# Patient Record
Sex: Female | Born: 1965 | Race: White | Hispanic: No | Smoking: Never smoker
Health system: Southern US, Community
[De-identification: ages and names within clinical notes are randomized; demographics above are authoritative.]

---

## 2001-06-11 ENCOUNTER — Other Ambulatory Visit: Admission: RE | Admit: 2001-06-11 | Discharge: 2001-06-11 | Payer: Self-pay | Admitting: Obstetrics and Gynecology

## 2006-03-04 ENCOUNTER — Ambulatory Visit (HOSPITAL_COMMUNITY): Admission: RE | Admit: 2006-03-04 | Discharge: 2006-03-04 | Payer: Self-pay | Admitting: Obstetrics and Gynecology

## 2006-12-29 IMAGING — US US BREAST*R*
1 series · 13 of 17 positions shown · non-contrast
Comparison: none

BILATERAL DIAGNOSTIC MAMMOGRAM

RIGHT BREAST ULTRASOUND
BILATERAL  DIAGNOSTIC MAMMOGRAM AND RIGHT BREAST ULTRASOUND:
CLINICAL DATA: 40-year-old with palpable abnormality right breast.

[Series 1: unknown · 0.06mm/px · 13 of 17 slices shown]
[im 1/17]
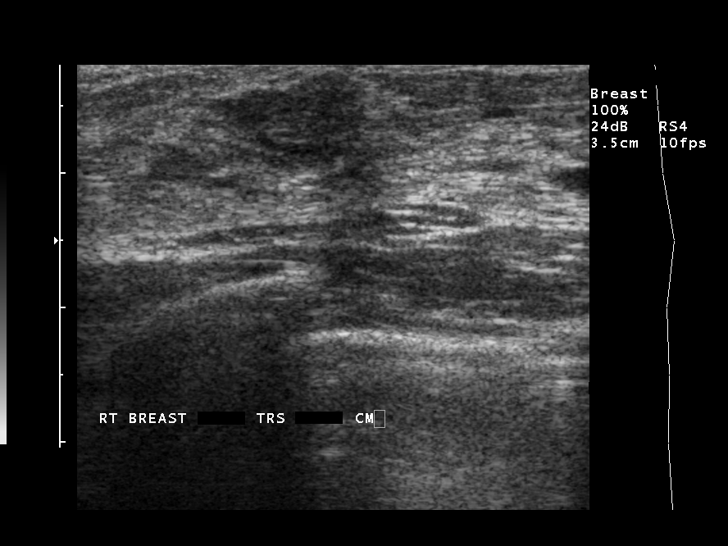
[im 2/17]
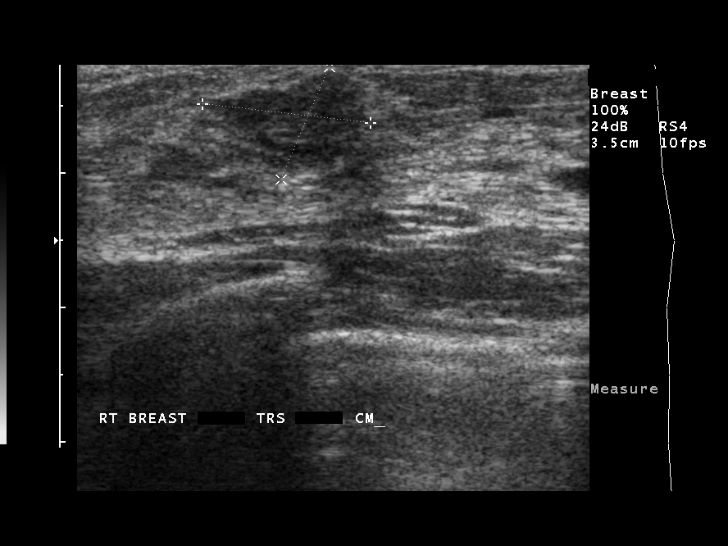
[im 4/17]
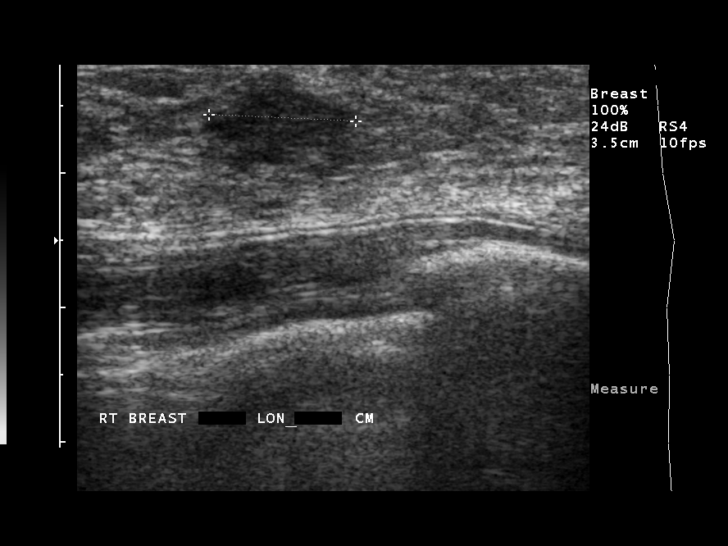
[im 5/17]
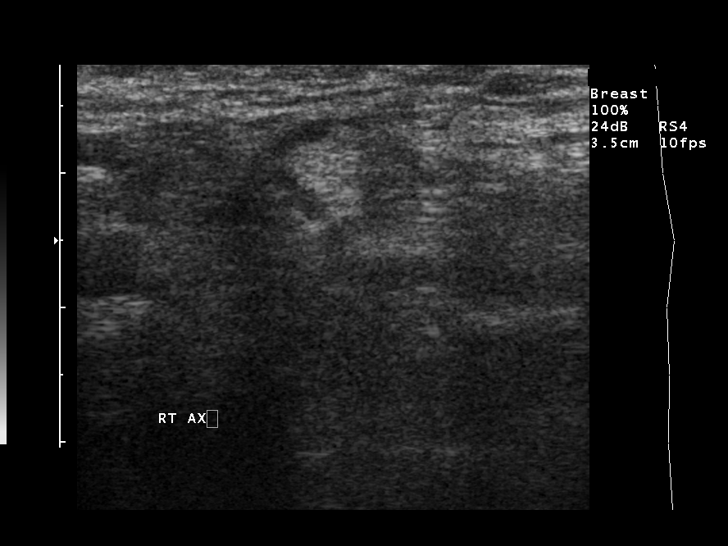
[im 6/17]
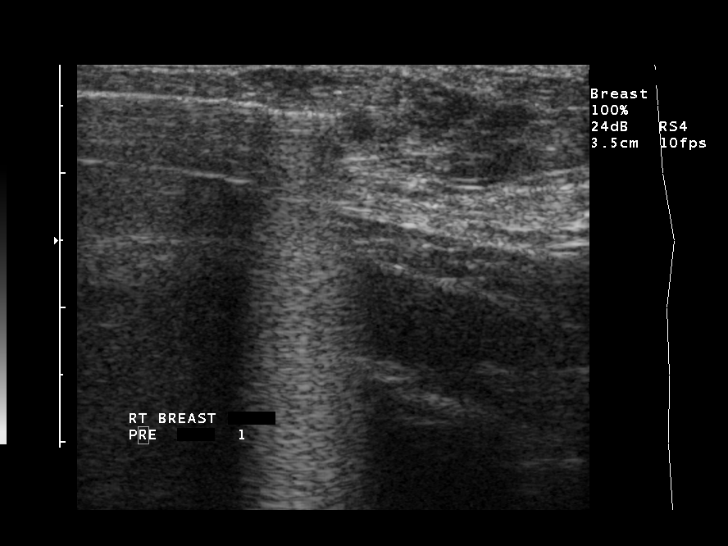
[im 8/17]
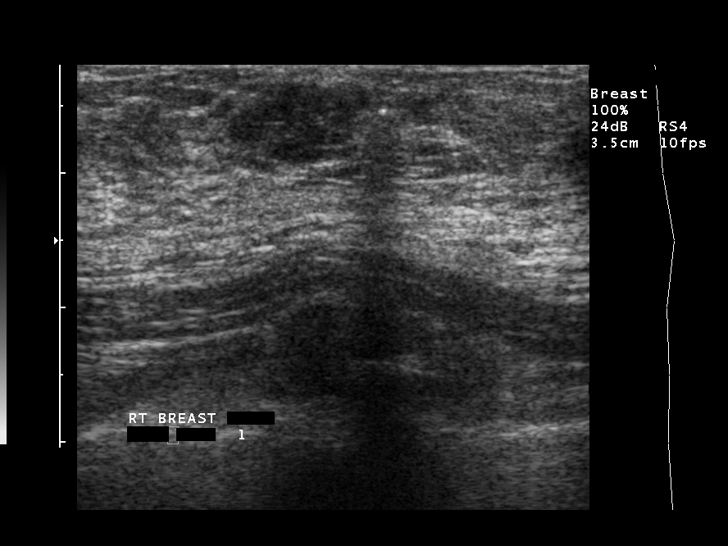
[im 9/17]
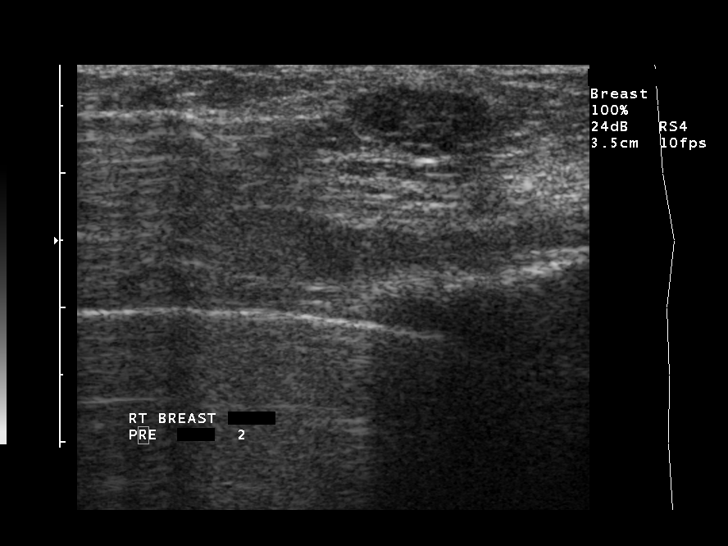
[im 10/17]
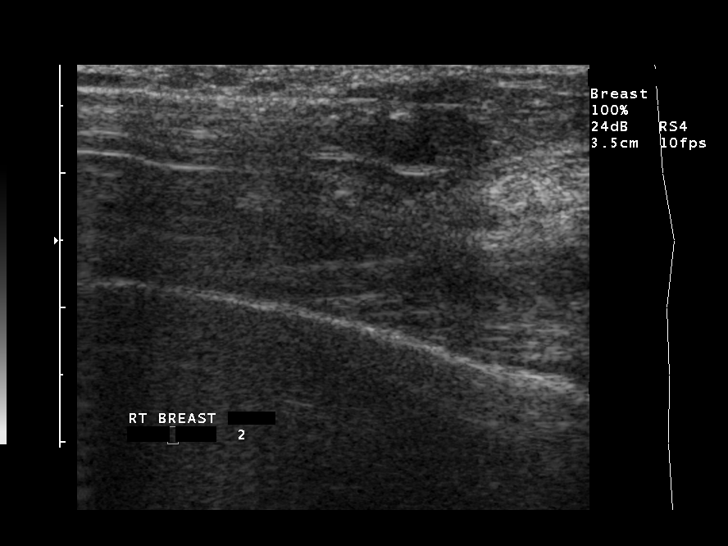
[im 12/17]
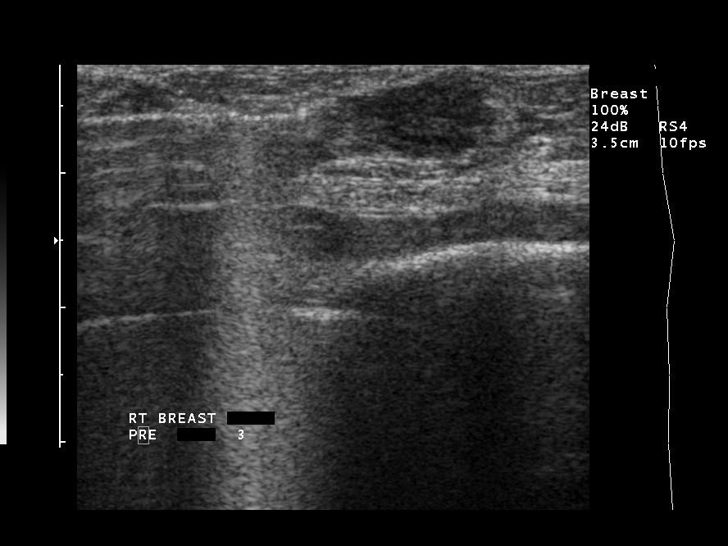
[im 13/17]
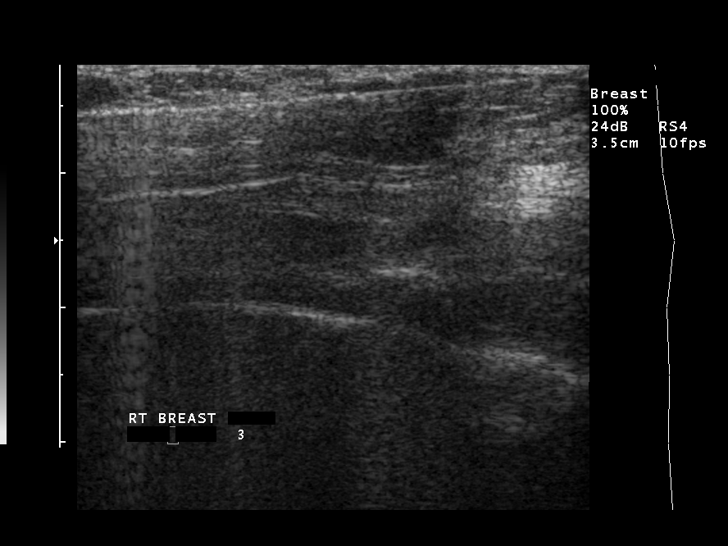
[im 14/17]
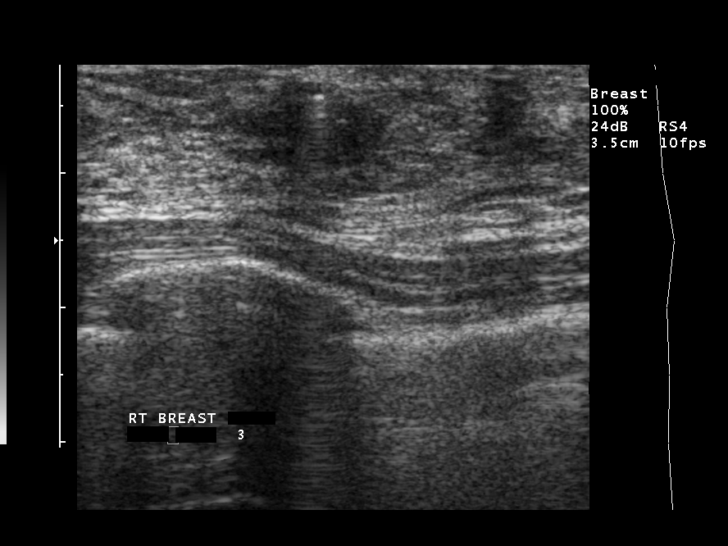
[im 16/17]
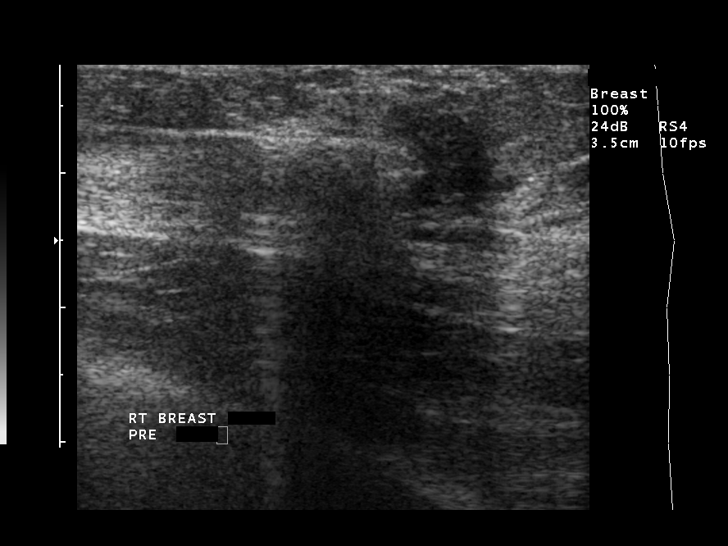
[im 17/17]
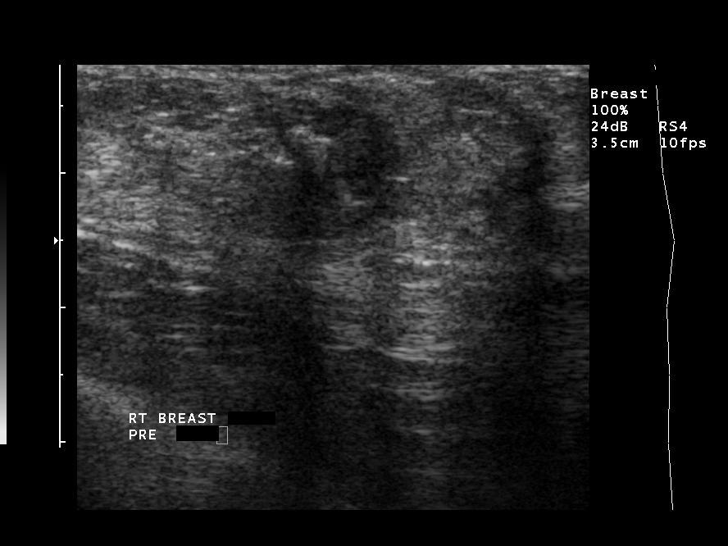

[13 of 17 positions shown; findings below may reference images not displayed]

Comparison with prior study 11-11-96.  Breast parenchyma is dense fibroglandular.  There is 
asymmetric density in the upper outer quadrant of the right breast corresponding to the area of the
patient's concern.  No associated distortion or calcifications identified.  Images of the left 
breast are unremarkable.

On physical exam, there is a soft palpable nodule in the 10 o'clock position of the right breast 5 
cm from the nipple. Ultrasound in this region shows extremely dense fibroglandular parenchyma.  
There is a small hypoechoic ill-defined nodule corresponding to the palpable abnormality.  This 
nodule is 13 x 9 x 11 mm.  Evaluation of the right axilla shows normal-appearing lymph nodes.
IMPRESSION: Indeterminate palpable mass in the right breast 10 o'clock position.  Biopsy is suggested.  I 
discussed the findings with the patient.  Biopsy is performed on the same day and dictated 
separately.  The findings were discussed with Dr. Glassl.

ASSESSMENT: Suspicious - BI-RADS 4

Needle biopsy of the right breast.
,

## 2019-09-20 ENCOUNTER — Other Ambulatory Visit: Payer: Self-pay

## 2019-09-20 ENCOUNTER — Ambulatory Visit: Payer: 59 | Attending: Internal Medicine

## 2019-09-20 DIAGNOSIS — U071 COVID-19: Secondary | ICD-10-CM | POA: Insufficient documentation

## 2019-09-20 DIAGNOSIS — Z20822 Contact with and (suspected) exposure to covid-19: Secondary | ICD-10-CM

## 2019-09-22 ENCOUNTER — Telehealth: Payer: Self-pay

## 2019-09-22 ENCOUNTER — Telehealth: Payer: Self-pay | Admitting: *Deleted

## 2019-09-22 LAB — NOVEL CORONAVIRUS, NAA: SARS-CoV-2, NAA: DETECTED — AB

## 2019-09-22 NOTE — Telephone Encounter (Signed)
Pt. Checking on COVID 19 results, not available yet. °

## 2019-09-22 NOTE — Telephone Encounter (Signed)
Pt notified that her covid 19 results have not resulted. She voiced understanding.

## 2020-09-27 ENCOUNTER — Encounter (INDEPENDENT_AMBULATORY_CARE_PROVIDER_SITE_OTHER): Payer: Self-pay | Admitting: *Deleted

## 2020-10-11 ENCOUNTER — Other Ambulatory Visit (INDEPENDENT_AMBULATORY_CARE_PROVIDER_SITE_OTHER): Payer: Self-pay

## 2020-10-11 ENCOUNTER — Telehealth (INDEPENDENT_AMBULATORY_CARE_PROVIDER_SITE_OTHER): Payer: Self-pay

## 2020-10-11 ENCOUNTER — Encounter (INDEPENDENT_AMBULATORY_CARE_PROVIDER_SITE_OTHER): Payer: Self-pay | Admitting: Internal Medicine

## 2020-10-11 ENCOUNTER — Encounter (INDEPENDENT_AMBULATORY_CARE_PROVIDER_SITE_OTHER): Payer: Self-pay

## 2020-10-11 ENCOUNTER — Ambulatory Visit (INDEPENDENT_AMBULATORY_CARE_PROVIDER_SITE_OTHER): Payer: 59 | Admitting: Internal Medicine

## 2020-10-11 ENCOUNTER — Other Ambulatory Visit: Payer: Self-pay

## 2020-10-11 DIAGNOSIS — K5900 Constipation, unspecified: Secondary | ICD-10-CM | POA: Insufficient documentation

## 2020-10-11 DIAGNOSIS — K921 Melena: Secondary | ICD-10-CM

## 2020-10-11 DIAGNOSIS — Z1211 Encounter for screening for malignant neoplasm of colon: Secondary | ICD-10-CM

## 2020-10-11 DIAGNOSIS — R109 Unspecified abdominal pain: Secondary | ICD-10-CM | POA: Diagnosis not present

## 2020-10-11 LAB — CBC: MPV: 11.6 fL (ref 7.5–12.5)

## 2020-10-11 MED ORDER — METAMUCIL SMOOTH TEXTURE 58.6 % PO POWD: 1.0000 | Freq: Every day | ORAL | Status: AC

## 2020-10-11 MED ORDER — PEG 3350-KCL-NA BICARB-NACL 420 G PO SOLR: 4000.0000 mL | ORAL | 0 refills | Status: DC

## 2020-10-11 NOTE — Telephone Encounter (Signed)
LeighAnn Beacher Every, CMA  

## 2020-10-11 NOTE — Progress Notes (Signed)
Presenting complaint;  Abdominal pain and melena.  History of present illness.  Patient is 55 year old Caucasian female who who is being evaluated at her request by Dr. Jomarie Longs by whom patient was seen at [facility] urgent care on 09/26/2018 for abdominal pain and melena.  She was begun on omeprazole and referred to our office.  Her hemoglobin was 14.1 g.  Patient states her present illness started in November 2021 when she developed nausea vomiting diarrhea and severe abdominal cramping.  Her symptoms resolved after 24 hours and she felt she either had viral illness or food poisoning.  However symptoms recurred on New Year's eve when she had mid abdominal cramping which lasted for at least 30 minutes.  She did not have other symptoms such as nausea vomiting fever or chills.  Following this episode she felt very weak and did not have a bowel movement for 1 week.  She says she is prone to constipation and her average frequency is 2 bowel movements per week.  She also has experienced abdominal pain as well as back pain and she has been using heating pad at night.  She started to take Metamucil and she finally had bowel movement on day 8.   She has been having black stools for at least 2 weeks.  She has saved pictures on her smart phone and shared these with me.  She has had multiple black stools most of which have been formed.  There was no fresh blood.  She says her appetite is fair.  She denies heartburn or dysphagia.  She has lost 8 pounds in last year. She does not take OTC NSAIDs.  She has never been diagnosed with peptic ulcer disease.  She has not been screened for colorectal carcinoma.  Current Medications: Outpatient Encounter Medications as of 10/11/2020  Medication Sig  . acetaminophen (TYLENOL) 325 MG tablet Take 650 mg by mouth every 6 (six) hours as needed.  Marland Kitchen omeprazole (PRILOSEC) 40 MG capsule Take 40 mg by mouth daily before breakfast.   No facility-administered encounter medications on  file as of 10/11/2020.   Past medical history  She had C-section in October 1984. She had another surgery 2 months later for removal of foreign body/sponge.  She had small bowel perforation repaired at that time.  Allergies  No Known Allergies   Family history  Father died at age 71 of cancer type unknown in 27. Mother died in 7 at age 68.  She had severe hypoproteinemia.  Patient does not remember as to the etiology. She has 2 sisters.  One sister has stomach issues and he wonders if she has been diagnosed with celiac disease.  Social history  She is divorced.  She has daughter age 38 in good health. 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 She does not smoke cigarettes or drink alcohol. She is very active but does not do any scheduled exercise.  Physical examination Blood pressure (!) 145/81, pulse 68, temperature 99.2 F (37.3 C), temperature source Oral, height 5' 6.5" (1.689 m), weight 138 lb 1.6 oz (62.6 kg). Patient is alert and in no acute distress. She is wearing a mask. Conjunctiva is pink. Sclera is nonicteric Oropharyngeal mucosa is normal. No neck masses or thyromegaly noted. Cardiac exam with regular rhythm normal S1 and S2. No murmur or gallop noted. Lungs are clear to auscultation. Abdomen abdomen is symmetrical.  Bowel sounds are normal.  On  palpation is soft.  She has mild tenderness in hypogastric region.  No organomegaly or masses. No LE edema or clubbing noted.  Labs/studies Results:  Hemoglobin 14.1 g on 09/26/2020.   Assessment:  #1.  Melena.  She does not have history of peptic ulcer disease and she does not take OTC NSAIDs.  It remains to be seen if she has peptic ulcer disease or right colonic lesion to explain her melena and other symptoms.  #2.  Abdominal cramping.  She has had 2 episodes in the last 2 months.  She has remote history of bowel  perforation resulting from foreign bodies sponge left in her abdomen.  Perforation was repaired as soon as identified in December 1984.  Doubt that she has developed small bowel stricture after so many years.  #3.  Constipation.  She appears to have chronic constipation which got worse during her acute illness.  Recommendations  Continue omeprazole at a dose of 40 mg by mouth 30 minutes before breakfast daily. Continue Metamucil 1 packet or 4 g by mouth daily at bedtime. Patient advised not to use OTC stimulant laxatives. Patient will go to the lab for CBC, TSH and comprehensive chemistry panel. Diagnostic esophagogastroduodenoscopy and colonoscopy to be scheduled in near future. Further recommendations to follow.

## 2020-10-12 ENCOUNTER — Other Ambulatory Visit (INDEPENDENT_AMBULATORY_CARE_PROVIDER_SITE_OTHER): Payer: Self-pay

## 2020-10-12 LAB — CBC
HCT: 44.7 % (ref 35.0–45.0)
Hemoglobin: 15.3 g/dL (ref 11.7–15.5)
MCH: 29.9 pg (ref 27.0–33.0)
MCHC: 34.2 g/dL (ref 32.0–36.0)
MCV: 87.3 fL (ref 80.0–100.0)
Platelets: 282 10*3/uL (ref 140–400)
RBC: 5.12 10*6/uL — ABNORMAL HIGH (ref 3.80–5.10)
RDW: 11.4 % (ref 11.0–15.0)
WBC: 6.9 10*3/uL (ref 3.8–10.8)

## 2020-10-12 LAB — COMPREHENSIVE METABOLIC PANEL
AG Ratio: 1.7 (calc) (ref 1.0–2.5)
ALT: 9 U/L (ref 6–29)
AST: 14 U/L (ref 10–35)
Albumin: 4.3 g/dL (ref 3.6–5.1)
Alkaline phosphatase (APISO): 52 U/L (ref 37–153)
BUN: 12 mg/dL (ref 7–25)
CO2: 30 mmol/L (ref 20–32)
Calcium: 10.3 mg/dL (ref 8.6–10.4)
Chloride: 104 mmol/L (ref 98–110)
Creat: 0.9 mg/dL (ref 0.50–1.05)
Globulin: 2.6 g/dL (calc) (ref 1.9–3.7)
Glucose, Bld: 87 mg/dL (ref 65–99)
Potassium: 4.3 mmol/L (ref 3.5–5.3)
Sodium: 141 mmol/L (ref 135–146)
Total Bilirubin: 0.5 mg/dL (ref 0.2–1.2)
Total Protein: 6.9 g/dL (ref 6.1–8.1)

## 2020-10-12 LAB — TSH: TSH: 2.51 mIU/L (ref 0.40–4.50)

## 2020-10-17 ENCOUNTER — Other Ambulatory Visit (INDEPENDENT_AMBULATORY_CARE_PROVIDER_SITE_OTHER): Payer: Self-pay

## 2020-10-26 NOTE — Patient Instructions (Signed)
Judy Bennett  10/26/2020     @PREFPERIOPPHARMACY @   Your procedure is scheduled on  10/31/2020.    Report to [facility] at  [phone]  A.M.   Call this number if you have problems the morning of surgery:  [phone]   Remember:  Follow the diet and pre instructions given to you by the office.                        Take these medicines the morning of surgery with A SIP OF WATER       Prilosec.    Please brush your teeth.  Do not wear jewelry, make-up or nail polish.  Do not wear lotions, powders, or perfumes, or deodorant.  Do not shave 48 hours prior to surgery.  Men may shave face and neck.  Do not bring valuables to the hospital.  [facility] is not responsible for any belongings or valuables.  Contacts, dentures or bridgework may not be worn into surgery.  Leave your suitcase in the car.  After surgery it may be brought to your room.  For patients admitted to the hospital, discharge time will be determined by your treatment team.  Patients discharged the day of surgery will not be allowed to drive home and must have someone with them for 24 hours.   Special instructions:  DO NOT smoke tobacco or vape the morning of your procedure.   Please read over the following fact sheets that you were given. Anesthesia Post-op Instructions and Care and Recovery After Surgery       Upper Endoscopy, Adult, Care After This sheet gives you information about how to care for yourself after your procedure. Your health care provider may also give you more specific instructions. If you have problems or questions, contact your health care provider. What can I expect after the procedure? After the procedure, it is common to have:  A sore throat.  Mild stomach pain or discomfort.  Bloating.  Nausea. Follow these instructions at home:  Follow instructions from your health care provider about what to eat or drink after your procedure.  Return to your normal  activities as told by your health care provider. Ask your health care provider what activities are safe for you.  Take over-the-counter and prescription medicines only as told by your health care provider.  If you were given a sedative during the procedure, it can affect you for several hours. Do not drive or operate machinery until your health care provider says that it is safe.  Keep all follow-up visits as told by your health care provider. This is important.   Contact a health care provider if you have:  A sore throat that lasts longer than one day.  Trouble swallowing. Get help right away if:  You vomit blood or your vomit looks like coffee grounds.  You have: ? A fever. ? Bloody, black, or tarry stools. ? A severe sore throat or you cannot swallow. ? Difficulty breathing. ? Severe pain in your chest or abdomen. Summary  After the procedure, it is common to have a sore throat, mild stomach discomfort, bloating, and nausea.  If you were given a sedative during the procedure, it can affect you for several hours. Do not drive or operate machinery until your health care provider says that it is safe.  Follow instructions from your health care provider about what to eat or drink  after your procedure.  Return to your normal activities as told by your health care provider. This information is not intended to replace advice given to you by your health care provider. Make sure you discuss any questions you have with your health care provider. Document Revised: 08/30/2019 Document Reviewed: 02/01/2018 Elsevier Patient Education  2021 [place].  Colonoscopy, Adult, Care After This sheet gives you information about how to care for yourself after your procedure. Your health care provider may also give you more specific instructions. If you have problems or questions, contact your health care provider. What can I expect after the procedure? After the procedure, it is common to  have:  A small amount of blood in your stool for 24 hours after the procedure.  Some gas.  Mild cramping or bloating of your abdomen. Follow these instructions at home: Eating and drinking  Drink enough fluid to keep your urine pale yellow.  Follow instructions from your health care provider about eating or drinking restrictions.  Resume your normal diet as instructed by your health care provider. Avoid heavy or fried foods that are hard to digest.   Activity  Rest as told by your health care provider.  Avoid sitting for a long time without moving. Get up to take short walks every 1-2 hours. This is important to improve blood flow and breathing. Ask for help if you feel weak or unsteady.  Return to your normal activities as told by your health care provider. Ask your health care provider what activities are safe for you. Managing cramping and bloating  Try walking around when you have cramps or feel bloated.  Apply heat to your abdomen as told by your health care provider. Use the heat source that your health care provider recommends, such as a moist heat pack or a heating pad. ? Place a towel between your skin and the heat source. ? Leave the heat on for 20-30 minutes. ? Remove the heat if your skin turns bright red. This is especially important if you are unable to feel pain, heat, or cold. You may have a greater risk of getting burned.   General instructions  If you were given a sedative during the procedure, it can affect you for several hours. Do not drive or operate machinery until your health care provider says that it is safe.  For the first 24 hours after the procedure: ? Do not sign important documents. ? Do not drink alcohol. ? Do your regular daily activities at a slower pace than normal. ? Eat soft foods that are easy to digest.  Take over-the-counter and prescription medicines only as told by your health care provider.  Keep all follow-up visits as told by your  health care provider. This is important. Contact a health care provider if:  You have blood in your stool 2-3 days after the procedure. Get help right away if you have:  More than a small spotting of blood in your stool.  Large blood clots in your stool.  Swelling of your abdomen.  Nausea or vomiting.  A fever.  Increasing pain in your abdomen that is not relieved with medicine. Summary  After the procedure, it is common to have a small amount of blood in your stool. You may also have mild cramping and bloating of your abdomen.  If you were given a sedative during the procedure, it can affect you for several hours. Do not drive or operate machinery until your health care provider says that  it is safe.  Get help right away if you have a lot of blood in your stool, nausea or vomiting, a fever, or increased pain in your abdomen. This information is not intended to replace advice given to you by your health care provider. Make sure you discuss any questions you have with your health care provider. Document Revised: 08/26/2019 Document Reviewed: 03/28/2019 Elsevier Patient Education  2021 [place] After This sheet gives you information about how to care for yourself after your procedure. Your health care provider may also give you more specific instructions. If you have problems or questions, contact your health care provider. What can I expect after the procedure? After the procedure, it is common to have:  Tiredness.  Forgetfulness about what happened after the procedure.  Impaired judgment for important decisions.  Nausea or vomiting.  Some difficulty with balance. Follow these instructions at home: For the time period you were told by your health care provider:  Rest as needed.  Do not participate in activities where you could fall or become injured.  Do not drive or use machinery.  Do not drink alcohol.  Do not take sleeping  pills or medicines that cause drowsiness.  Do not make important decisions or sign legal documents.  Do not take care of children on your own.      Eating and drinking  Follow the diet that is recommended by your health care provider.  Drink enough fluid to keep your urine pale yellow.  If you vomit: ? Drink water, juice, or soup when you can drink without vomiting. ? Make sure you have little or no nausea before eating solid foods. General instructions  Have a responsible adult stay with you for the time you are told. It is important to have someone help care for you until you are awake and alert.  Take over-the-counter and prescription medicines only as told by your health care provider.  If you have sleep apnea, surgery and certain medicines can increase your risk for breathing problems. Follow instructions from your health care provider about wearing your sleep device: ? Anytime you are sleeping, including during daytime naps. ? While taking prescription pain medicines, sleeping medicines, or medicines that make you drowsy.  Avoid smoking.  Keep all follow-up visits as told by your health care provider. This is important. Contact a health care provider if:  You keep feeling nauseous or you keep vomiting.  You feel light-headed.  You are still sleepy or having trouble with balance after 24 hours.  You develop a rash.  You have a fever.  You have redness or swelling around the IV site. Get help right away if:  You have trouble breathing.  You have new-onset confusion at home. Summary  For several hours after your procedure, you may feel tired. You may also be forgetful and have poor judgment.  Have a responsible adult stay with you for the time you are told. It is important to have someone help care for you until you are awake and alert.  Rest as told. Do not drive or operate machinery. Do not drink alcohol or take sleeping pills.  Get help right away if you  have trouble breathing, or if you suddenly become confused. This information is not intended to replace advice given to you by your health care provider. Make sure you discuss any questions you have with your health care provider. Document Revised: 05/17/2020 Document Reviewed: 08/04/2019 Elsevier Patient Education  2021  [employer].

## 2020-10-29 ENCOUNTER — Encounter (HOSPITAL_COMMUNITY)
Admission: RE | Admit: 2020-10-29 | Discharge: 2020-10-29 | Disposition: A | Discharge status: Home / Self Care | Payer: 59 | Source: Ambulatory Visit | Attending: Internal Medicine | Admitting: Internal Medicine

## 2020-10-29 ENCOUNTER — Other Ambulatory Visit (HOSPITAL_COMMUNITY)
Admission: RE | Admit: 2020-10-29 | Discharge: 2020-10-29 | Disposition: A | Discharge status: Home / Self Care | Payer: 59 | Source: Ambulatory Visit | Attending: Internal Medicine | Admitting: Internal Medicine

## 2020-10-29 ENCOUNTER — Other Ambulatory Visit: Payer: Self-pay

## 2020-10-29 DIAGNOSIS — Z01812 Encounter for preprocedural laboratory examination: Secondary | ICD-10-CM | POA: Insufficient documentation

## 2020-10-29 DIAGNOSIS — Z1211 Encounter for screening for malignant neoplasm of colon: Secondary | ICD-10-CM

## 2020-10-29 DIAGNOSIS — U071 COVID-19: Secondary | ICD-10-CM | POA: Insufficient documentation

## 2020-10-29 DIAGNOSIS — K921 Melena: Secondary | ICD-10-CM | POA: Diagnosis not present

## 2020-10-29 DIAGNOSIS — R109 Unspecified abdominal pain: Secondary | ICD-10-CM | POA: Diagnosis not present

## 2020-10-29 DIAGNOSIS — K5900 Constipation, unspecified: Secondary | ICD-10-CM | POA: Diagnosis not present

## 2020-10-30 LAB — SARS CORONAVIRUS 2 (TAT 6-24 HRS): SARS Coronavirus 2: POSITIVE — AB

## 2020-10-31 ENCOUNTER — Ambulatory Visit (HOSPITAL_COMMUNITY): Admission: RE | Admit: 2020-10-31 | Payer: 59 | Source: Home / Self Care | Admitting: Internal Medicine

## 2020-10-31 ENCOUNTER — Encounter (HOSPITAL_COMMUNITY): Admission: RE | Payer: Self-pay | Source: Home / Self Care

## 2020-10-31 SURGERY — COLONOSCOPY WITH PROPOFOL
Anesthesia: Monitor Anesthesia Care

## 2020-11-06 ENCOUNTER — Other Ambulatory Visit (INDEPENDENT_AMBULATORY_CARE_PROVIDER_SITE_OTHER): Payer: Self-pay

## 2020-11-06 DIAGNOSIS — R109 Unspecified abdominal pain: Secondary | ICD-10-CM

## 2020-11-06 DIAGNOSIS — K921 Melena: Secondary | ICD-10-CM

## 2020-11-07 ENCOUNTER — Encounter (INDEPENDENT_AMBULATORY_CARE_PROVIDER_SITE_OTHER): Payer: Self-pay

## 2020-11-27 ENCOUNTER — Encounter (HOSPITAL_COMMUNITY): Payer: Self-pay

## 2020-11-28 ENCOUNTER — Encounter (HOSPITAL_COMMUNITY)
Admission: RE | Admit: 2020-11-28 | Discharge: 2020-11-28 | Disposition: A | Discharge status: Home / Self Care | Payer: 59 | Source: Ambulatory Visit | Attending: Internal Medicine | Admitting: Internal Medicine

## 2020-12-05 ENCOUNTER — Ambulatory Visit (HOSPITAL_COMMUNITY)
Admission: RE | Admit: 2020-12-05 | Discharge: 2020-12-05 | Disposition: A | Discharge status: Home / Self Care | Payer: 59 | Attending: Internal Medicine | Admitting: Internal Medicine

## 2020-12-05 ENCOUNTER — Other Ambulatory Visit: Payer: Self-pay

## 2020-12-05 ENCOUNTER — Ambulatory Visit (HOSPITAL_COMMUNITY): Payer: 59 | Admitting: Certified Registered Nurse Anesthetist

## 2020-12-05 ENCOUNTER — Encounter (HOSPITAL_COMMUNITY): Payer: Self-pay | Admitting: Internal Medicine

## 2020-12-05 ENCOUNTER — Encounter (HOSPITAL_COMMUNITY)
Admission: RE | Disposition: A | Discharge status: Home / Self Care | Payer: Self-pay | Source: Home / Self Care | Attending: Internal Medicine

## 2020-12-05 DIAGNOSIS — K5900 Constipation, unspecified: Secondary | ICD-10-CM | POA: Insufficient documentation

## 2020-12-05 DIAGNOSIS — K644 Residual hemorrhoidal skin tags: Secondary | ICD-10-CM | POA: Insufficient documentation

## 2020-12-05 DIAGNOSIS — K5730 Diverticulosis of large intestine without perforation or abscess without bleeding: Secondary | ICD-10-CM | POA: Insufficient documentation

## 2020-12-05 DIAGNOSIS — D125 Benign neoplasm of sigmoid colon: Secondary | ICD-10-CM | POA: Insufficient documentation

## 2020-12-05 DIAGNOSIS — K209 Esophagitis, unspecified without bleeding: Secondary | ICD-10-CM | POA: Insufficient documentation

## 2020-12-05 DIAGNOSIS — K6289 Other specified diseases of anus and rectum: Secondary | ICD-10-CM

## 2020-12-05 DIAGNOSIS — Z1211 Encounter for screening for malignant neoplasm of colon: Secondary | ICD-10-CM

## 2020-12-05 DIAGNOSIS — R109 Unspecified abdominal pain: Secondary | ICD-10-CM | POA: Diagnosis present

## 2020-12-05 DIAGNOSIS — K921 Melena: Secondary | ICD-10-CM | POA: Diagnosis not present

## 2020-12-05 DIAGNOSIS — K449 Diaphragmatic hernia without obstruction or gangrene: Secondary | ICD-10-CM | POA: Insufficient documentation

## 2020-12-05 HISTORY — PX: POLYPECTOMY: SHX5525

## 2020-12-05 HISTORY — PX: ESOPHAGOGASTRODUODENOSCOPY (EGD) WITH PROPOFOL: SHX5813

## 2020-12-05 HISTORY — PX: COLONOSCOPY WITH PROPOFOL: SHX5780

## 2020-12-05 LAB — HM COLONOSCOPY

## 2020-12-05 SURGERY — COLONOSCOPY WITH PROPOFOL
Anesthesia: General

## 2020-12-05 MED ORDER — CHLORHEXIDINE GLUCONATE CLOTH 2 % EX PADS: 6.0000 | MEDICATED_PAD | Freq: Once | CUTANEOUS | Status: DC

## 2020-12-05 MED ORDER — PHENYLEPHRINE 40 MCG/ML (10ML) SYRINGE FOR IV PUSH (FOR BLOOD PRESSURE SUPPORT)
PREFILLED_SYRINGE | INTRAVENOUS | Status: AC
  Filled 2020-12-05: qty 10

## 2020-12-05 MED ORDER — LACTATED RINGERS IV SOLN: INTRAVENOUS | Status: DC

## 2020-12-05 MED ORDER — PROPOFOL 10 MG/ML IV BOLUS
INTRAVENOUS | Status: DC | PRN
  Administered 2020-12-05: 50 mg via INTRAVENOUS
  Administered 2020-12-05: 100 mg via INTRAVENOUS

## 2020-12-05 MED ORDER — LIDOCAINE HCL (PF) 2 % IJ SOLN
INTRAMUSCULAR | Status: AC
  Filled 2020-12-05: qty 5

## 2020-12-05 MED ORDER — LIDOCAINE HCL (CARDIAC) PF 100 MG/5ML IV SOSY
PREFILLED_SYRINGE | INTRAVENOUS | Status: DC | PRN
  Administered 2020-12-05: 50 mg via INTRAVENOUS

## 2020-12-05 MED ORDER — PROPOFOL 500 MG/50ML IV EMUL
INTRAVENOUS | Status: DC | PRN
  Administered 2020-12-05: 150 ug/kg/min via INTRAVENOUS

## 2020-12-05 MED ORDER — PANTOPRAZOLE SODIUM 40 MG PO TBEC: 40.0000 mg | DELAYED_RELEASE_TABLET | Freq: Every day | ORAL | 5 refills | Status: AC

## 2020-12-05 MED ORDER — PHENYLEPHRINE HCL (PRESSORS) 10 MG/ML IV SOLN
INTRAVENOUS | Status: DC | PRN
  Administered 2020-12-05: 80 ug via INTRAVENOUS

## 2020-12-05 NOTE — Transfer of Care (Signed)
Immediate Anesthesia Transfer of Care Note  Patient: Judy Bennett  Procedure(s) Performed: COLONOSCOPY WITH PROPOFOL (N/A ) ESOPHAGOGASTRODUODENOSCOPY (EGD) WITH PROPOFOL (N/A ) POLYPECTOMY  Patient Location: PACU  Anesthesia Type:General  Level of Consciousness: awake, alert  and oriented  Airway & Oxygen Therapy: Patient Spontanous Breathing  Post-op Assessment: Report given to RN and Post -op Vital signs reviewed and stable  Post vital signs: Reviewed and stable  Last Vitals:  Vitals Value Taken Time  BP 96/57 12/05/20 1004  Temp 36.6 C 12/05/20 1004  Pulse 73 12/05/20 1004  Resp 13 12/05/20 1004  SpO2 100 % 12/05/20 1004    Last Pain:  Vitals:   12/05/20 1004  TempSrc: Oral  PainSc: 0-No pain      Patients Stated Pain Goal: 5 (88/82/80 0349)  Complications: No complications documented.

## 2020-12-05 NOTE — Anesthesia Postprocedure Evaluation (Signed)
Anesthesia Post Note  Patient: Judy Bennett  Procedure(s) Performed: COLONOSCOPY WITH PROPOFOL (N/A ) ESOPHAGOGASTRODUODENOSCOPY (EGD) WITH PROPOFOL (N/A ) POLYPECTOMY  Patient location during evaluation: Phase II Anesthesia Type: General Level of consciousness: awake and alert Pain management: satisfactory to patient Vital Signs Assessment: post-procedure vital signs reviewed and stable Respiratory status: spontaneous breathing and respiratory function stable Cardiovascular status: blood pressure returned to baseline and stable Postop Assessment: no apparent nausea or vomiting Anesthetic complications: no   No complications documented.   Last Vitals:  Vitals:   12/05/20 0849 12/05/20 1004  BP: 117/69 (!) 96/57  Pulse: 75 73  Resp: 12 13  Temp: 36.9 C 36.6 C  SpO2: 100% 100%    Last Pain:  Vitals:   12/05/20 1004  TempSrc: Oral  PainSc: 0-No pain                 Karna Dupes

## 2020-12-05 NOTE — H&P (Signed)
Judy Bennett is an 55 y.o. female.   Chief Complaint: Patient is here for esophagogastroduodenoscopy and colonoscopy. HPI: Patient is 55 year old Caucasian female who was seen in the office about 8 weeks ago for history of melena lower abdominal pain as well as constipation.  She was begun on PPI.  Her H&H was normal.  She is undergoing diagnostic EGD and colonoscopy.  She says she feels much better she has not had any episodes of melena.  Her abdominal pain has resolved.  Her bowels are moving regularly.  She is not taking any NSAIDs. Family history is negative for colorectal carcinoma.  History reviewed. No pertinent past medical history.  Past Surgical History:  Procedure Laterality Date  . CESAREAN SECTION     37 years ago    Family History  Problem Relation Age of Onset  . Kidney disease Mother   . Diabetes Father   . Thyroid disease Sister   . Healthy Sister    Social History:  reports that she has never smoked. She has never used smokeless tobacco. She reports previous drug use. She reports that she does not drink alcohol.  Allergies: No Known Allergies  Medications Prior to Admission  Medication Sig Dispense Refill  . acetaminophen (TYLENOL) 500 MG tablet Take 1,000 mg by mouth every 6 (six) hours as needed for moderate pain or headache.    . polyethylene glycol-electrolytes (TRILYTE) 420 g solution Take 4,000 mLs by mouth as directed. 4000 mL 0  . psyllium (METAMUCIL SMOOTH TEXTURE) 58.6 % powder Take 1 packet by mouth at bedtime. (Patient not taking: Reported on 11/15/2020)      No results found for this or any previous visit (from the past 48 hour(s)). No results found.  Review of Systems  Blood pressure 117/69, pulse 75, temperature 98.4 F (36.9 C), temperature source Oral, resp. rate 12, height 5' 6.5" (1.689 m), weight 62.6 kg, SpO2 100 %. Physical Exam HENT:     Mouth/Throat:     Mouth: Mucous membranes are moist.     Pharynx: Oropharynx is clear.   Eyes:     General: No scleral icterus.    Conjunctiva/sclera: Conjunctivae normal.  Cardiovascular:     Rate and Rhythm: Normal rate and regular rhythm.     Heart sounds: Normal heart sounds. No gallop.   Pulmonary:     Effort: Pulmonary effort is normal.     Breath sounds: Normal breath sounds.  Abdominal:     Comments: Abdomen is flat with Pfannenstiel scar.  On palpation is soft and nontender with organomegaly or masses.  Musculoskeletal:        General: No swelling.  Lymphadenopathy:     Cervical: No cervical adenopathy.  Skin:    General: Skin is warm and dry.  Neurological:     Mental Status: She is alert.      Assessment/Plan  History of melena, abdominal pain and constipation. Diagnostic esophagogastroduodenoscopy and colonoscopy.  Hildred Laser, MD 12/05/2020, 9:10 AM

## 2020-12-05 NOTE — Op Note (Signed)
[facility] Patient Name: Judy Bennett Procedure Date: 12/05/2020 9:07 AM MRN: [id number] Date of Birth: 1965-10-16 Attending MD: Hildred Laser , MD CSN: [id number] Age: 55 Admit Type: Outpatient Procedure:                Upper GI endoscopy Indications:              Melena Providers:                Hildred Laser, MD, Gwenlyn Fudge, RN, Randa Spike, Technician Referring MD:              Medicines:                Propofol per Anesthesia Complications:            No immediate complications. Estimated Blood Loss:     Estimated blood loss: none. Procedure:                Pre-Anesthesia Assessment:                           - Prior to the procedure, a History and Physical                            was performed, and patient medications and                            allergies were reviewed. The patient's tolerance of                            previous anesthesia was also reviewed. The risks                            and benefits of the procedure and the sedation                            options and risks were discussed with the patient.                            All questions were answered, and informed consent                            was obtained. Prior Anticoagulants: The patient has                            taken no previous anticoagulant or antiplatelet                            agents. ASA Grade Assessment: I - A normal, healthy                            patient. After reviewing the risks and benefits,                            the  patient was deemed in satisfactory condition to                            undergo the procedure.                           After obtaining informed consent, the endoscope was                            passed under direct vision. Throughout the                            procedure, the patient's blood pressure, pulse, and                            oxygen saturations were monitored continuously. The                             [id number]) was introduced through the mouth,                            and advanced to the second part of duodenum. The                            upper GI endoscopy was accomplished without                            difficulty. The patient tolerated the procedure                            well. Scope In: 9:30:33 AM Scope Out: 9:35:05 AM Total Procedure Duration: 0 hours 4 minutes 32 seconds  Findings:      The hypopharynx was normal.      The proximal esophagus and mid esophagus were normal.      LA Grade A (one or more mucosal breaks less than 5 mm, not extending       between tops of 2 mucosal folds) esophagitis with no bleeding was found       39 cm from the incisors( GEJ).      A 2 cm hiatal hernia was present.      The entire examined stomach was normal.      The duodenal bulb and second portion of the duodenum were normal. Impression:               - Normal hypopharynx.                           - Normal proximal esophagus and mid esophagus.                           - LA Grade A esophagitis. Erosion covered with                            small clot but no active bleeding.                           -  2 cm hiatal hernia.                           - Normal stomach.                           - Normal duodenal bulb and second portion of the                            duodenum.                           - No specimens collected. Moderate Sedation:      Per Anesthesia Care Recommendation:           - Patient has a contact number available for                            emergencies. The signs and symptoms of potential                            delayed complications were discussed with the                            patient. Return to normal activities tomorrow.                            Written discharge instructions were provided to the                            patient.                           - Resume previous diet today.                            - Continue present medications.                           - Use Protonix (pantoprazole) 40 mg PO daily. Procedure Code(s):        --- Professional ---                           [phone], Esophagogastroduodenoscopy, flexible,                            transoral; diagnostic, including collection of                            specimen(s) by brushing or washing, when performed                            (separate procedure) Diagnosis Code(s):        --- Professional ---                           K20.90, Esophagitis, unspecified without bleeding  K44.9, Diaphragmatic hernia without obstruction or                            gangrene                           K92.1, Melena (includes Hematochezia) CPT copyright 2019 [employer]. All rights reserved. The codes documented in this report are preliminary and upon coder review may  be revised to meet current compliance requirements. Hildred Laser, MD Hildred Laser, MD 12/05/2020 10:09:17 AM This report has been signed electronically. Number of Addenda: 0

## 2020-12-05 NOTE — Discharge Instructions (Signed)
No aspirin or NSAIDs for 24 hours. Resume usual medications. Pantoprazole 40 mg by mouth 30 minutes before breakfast daily. High-fiber diet. No driving for 24 hours. Physician will call with biopsy results.   Upper Endoscopy, Adult, Care After This sheet gives you information about how to care for yourself after your procedure. Your health care provider may also give you more specific instructions. If you have problems or questions, contact your health care provider. What can I expect after the procedure? After the procedure, it is common to have:  A sore throat.  Mild stomach pain or discomfort.  Bloating.  Nausea. Follow these instructions at home:  Follow instructions from your health care provider about what to eat or drink after your procedure.  Return to your normal activities as told by your health care provider. Ask your health care provider what activities are safe for you.  Take over-the-counter and prescription medicines only as told by your health care provider.  If you were given a sedative during the procedure, it can affect you for several hours. Do not drive or operate machinery until your health care provider says that it is safe.  Keep all follow-up visits as told by your health care provider. This is important.   Contact a health care provider if you have:  A sore throat that lasts longer than one day.  Trouble swallowing. Get help right away if:  You vomit blood or your vomit looks like coffee grounds.  You have: ? A fever. ? Bloody, black, or tarry stools. ? A severe sore throat or you cannot swallow. ? Difficulty breathing. ? Severe pain in your chest or abdomen. Summary  After the procedure, it is common to have a sore throat, mild stomach discomfort, bloating, and nausea.  If you were given a sedative during the procedure, it can affect you for several hours. Do not drive or operate machinery until your health care provider says that it is  safe.  Follow instructions from your health care provider about what to eat or drink after your procedure.  Return to your normal activities as told by your health care provider. This information is not intended to replace advice given to you by your health care provider. Make sure you discuss any questions you have with your health care provider. Document Revised: 08/30/2019 Document Reviewed: 02/01/2018 Elsevier Patient Education  2021 [place].   Esophagitis  Esophagitis is inflammation of the esophagus. The esophagus is the tube that carries food from the mouth to the stomach. Esophagitis can cause soreness or pain in the esophagus. This condition can make it difficult and painful to swallow. What are the causes? Most causes of esophagitis are not serious. Common causes of this condition include:  Gastroesophageal reflux disease (GERD). This is when stomach contents move back up into the esophagus (reflux).  Repeated vomiting.  An allergic reaction, especially caused by food allergies (eosinophilic esophagitis).  Injury to the esophagus by swallowing large pills with or without water, or swallowing certain types of medicines.  Swallowing harmful chemicals, such as household cleaning products.  Drinking a lot of alcohol.  An infection of the esophagus. This most often occurs in people who have a weakened immune system.  Radiation or chemotherapy treatment for cancer.  Certain diseases such as sarcoidosis, Crohn's disease, and scleroderma. What are the signs or symptoms? Symptoms of this condition include:  Difficult or painful swallowing.  Pain with swallowing acidic liquids, such as citrus juices. You may also have pain  when you burp.  Chest pain and difficulty breathing.  Nausea and vomiting.  Pain in the abdomen.  Weight loss.  Ulcers in the mouth and white patches in the mouth (candidiasis).  Fever.  Coughing up blood or vomiting blood.  Stool that is  black, tarry, or bright red. How is this diagnosed? This condition may be diagnosed based on your medical history and a physical exam. You may also have other tests, including:  A test to examine your esophagus and stomach with a small flexible tube with a camera (endoscopy).  A test that measures the acidity level in your esophagus.  A test that measures how much pressure is on your esophagus.  A barium swallow or modified barium swallow to show the shape, size, and functioning of your esophagus.  Allergy tests. How is this treated? Treatment for this condition depends on the cause of your esophagitis. In some cases, steroids or other medicines may be given to help relieve your symptoms or to treat the underlying cause of your condition. You may have to make some lifestyle changes, such as:  Avoiding alcohol.  Quitting any products that contain nicotine or tobacco. These products include cigarettes, chewing tobacco, and vaping devices, such as e-cigarettes. If you need help quitting, ask your health care provider.  Changing your diet.  Exercising.  Changing your sleep habits and your sleep environment. Follow these instructions at home: Medicines  Take over-the-counter and prescription medicines only as told by your health care provider.  Do not take aspirin, ibuprofen, or other NSAIDs unless your health care provider told you to do so.  If you have trouble taking pills: ? Use a pill splitter to decrease the size of the pill. This will decrease the chance of the pill getting stuck or injuring your esophagus. ? Drink water after you take a pill. Eating and drinking  Avoid foods and drinks that seem to make your symptoms worse.  Follow a diet as recommended by your health care provider. This may involve avoiding foods and drinks such as: ? Coffee and tea, with or without caffeine. ? Drinks that contain alcohol. ? Energy drinks and sports drinks. ? Carbonated drinks or  sodas. ? Chocolate and cocoa. ? Peppermint and mint flavorings. ? Garlic and onions. ? Horseradish. ? Spicy and acidic foods, including peppers, chili powder, curry powder, vinegar, hot sauces, and barbecue sauce. ? Citrus fruit juices and citrus fruits, such as oranges, lemons, and limes. ? Tomato-based foods, such as red sauce, chili, salsa, and pizza with red sauce. ? Fried and fatty foods, such as donuts, french fries, potato chips, and high-fat dressings. ? High-fat meats, such as hot dogs and fatty cuts of red and white meats, such as rib eye steak, sausage, ham, and bacon. ? High-fat dairy items, such as whole milk, butter, and cream cheese.   Lifestyle  Eat small, frequent meals instead of large meals.  Avoid drinking large amounts of liquid with your meals.  Avoid eating meals during the 2-3 hours before bedtime.  Avoid lying down right after you eat.  Do not exercise right after you eat.  Do not use any products that contain nicotine or tobacco. These products include cigarettes, chewing tobacco, and vaping devices, such as e-cigarettes. If you need help quitting, ask your health care provider. General instructions  Pay attention to any changes in your symptoms. Let your health care provider know about them.  Wear loose-fitting clothing. Do not wear anything tight around your waist that  causes pressure on your abdomen.  Raise (elevate) the head of your bed about 6 inches (15 cm). You may need to use a wedge to do this.  Try relaxation strategies such as yoga, deep breathing, or meditation to manage stress. If you need help reducing stress, ask your health care provider.  If you are overweight, reduce your weight to an amount that is healthy for you. Ask your health care provider for guidance about a safe weight loss goal.  Keep all follow-up visits. This is important.   Contact a health care provider if:  You have new symptoms.  You have unexplained weight  loss.  You have difficulty swallowing, or it hurts to swallow.  You have wheezing or a cough that does not go away.  Your symptoms do not improve with treatment.  You have frequent heartburn for more than two weeks. Get help right away if:  You have sudden severe pain in your arms, neck, jaw, teeth, or back.  You suddenly feel sweaty, dizzy, or light-headed.  You have chest pain or shortness of breath.  You vomit and the vomit is green, yellow, or black, or it looks like blood or coffee grounds.  Your stool is red, bloody, or black.  You have a fever.  You cannot swallow, drink, or eat. These symptoms may represent a serious problem that is an emergency. Do not wait to see if the symptoms will go away. Get medical help right away. Call your local emergency services (911 in the [place] Do not drive yourself to the hospital. Summary  Esophagitis is inflammation of the esophagus.  Most causes of esophagitis are not serious.  Follow your health care provider's instructions about eating and drinking.  Contact a health care provider if you have new symptoms, have weight loss, or coughing that does not stop.  Get help right away if you have severe pain in the arms, neck, jaw, teeth, or back, or if you have chest pain, shortness of breath, or fever. This information is not intended to replace advice given to you by your health care provider. Make sure you discuss any questions you have with your health care provider. Document Revised: 03/12/2020 Document Reviewed: 03/12/2020 Elsevier Patient Education  2021 [place], Adult     A hernia happens when tissue inside your body pushes out through a weak spot in your belly muscles (abdominal wall). This makes a round lump (bulge). The lump may be:  In a scar from surgery that was done in your belly (incisional hernia).  Near your belly button (umbilical hernia).  In your groin (inguinal hernia). Your groin is the area  where your leg meets your lower belly (abdomen). This kind of hernia could also be: ? In your scrotum, if you are female. ? In folds of skin around your vagina, if you are female.  In your upper thigh (femoral hernia).  Inside your belly (hiatal hernia). This happens when your stomach slides above the muscle between your belly and your chest (diaphragm). If your hernia is small and it does not cause pain, you may not need treatment. If your hernia is large or it causes pain, you may need surgery. Follow these instructions at home: Activity  Avoid stretching or overusing (straining) the muscles near your hernia. Straining can happen when you: ? Lift something heavy. ? Poop (have a bowel movement).  Do not lift anything that is heavier than 10 lb (4.5 kg), or the limit that you are  told, until your doctor says that it is safe.  Use the strength of your legs when you lift something heavy. Do not use only your back muscles to lift. General instructions  Do these things if told by your doctor so you do not have trouble pooping (constipation): ? Drink enough fluid to keep your pee (urine) pale yellow. ? Eat foods that are high in fiber. These include fresh fruits and vegetables, whole grains, and beans. ? Limit foods that are high in fat and processed sugars. These include foods that are fried or sweet. ? Take medicine for trouble pooping.  When you cough, try to cough gently.  You may try to push your hernia in by very gently pressing on it when you are lying down. Do not try to force the bulge back in if it will not push in easily.  If you are overweight, work with your doctor to lose weight safely.  Do not use any products that have nicotine or tobacco in them. These include cigarettes and e-cigarettes. If you need help quitting, ask your doctor.  If you will be having surgery (hernia repair), watch your hernia for changes in shape, size, or color. Tell your doctor if you see any  changes.  Take over-the-counter and prescription medicines only as told by your doctor.  Keep all follow-up visits as told by your doctor. Contact a doctor if:  You get new pain, swelling, or redness near your hernia.  You poop fewer times in a week than normal.  You have trouble pooping.  You have poop (stool) that is more dry than normal.  You have poop that is harder or larger than normal. Get help right away if:  You have a fever.  You have belly pain that gets worse.  You feel sick to your stomach (nauseous).  You throw up (vomit).  Your hernia cannot be pushed in by very gently pressing on it when you are lying down. Do not try to force the bulge back in if it will not push in easily.  Your hernia: ? Changes in shape or size. ? Changes color. ? Feels hard or it hurts when you touch it. These symptoms may represent a serious problem that is an emergency. Do not wait to see if the symptoms will go away. Get medical help right away. Call your local emergency services (911 in the [place] Summary  A hernia happens when tissue inside your body pushes out through a weak spot in the belly muscles. This creates a bulge.  If your hernia is small and it does not hurt, you may not need treatment. If your hernia is large or it hurts, you may need surgery.  If you will be having surgery, watch your hernia for changes in shape, size, or color. Tell your doctor about any changes. This information is not intended to replace advice given to you by your health care provider. Make sure you discuss any questions you have with your health care provider. Document Revised: 12/23/2018 Document Reviewed: 06/03/2017 Elsevier Patient Education  2021 [place].   Colon Polyps  Colon polyps are tissue growths inside the colon, which is part of the large intestine. They are one of the types of polyps that can grow in the body. A polyp may be a round bump or a mushroom-shaped growth. You  could have one polyp or more than one. Most colon polyps are noncancerous (benign). However, some colon polyps can become cancerous over time. Finding and  removing the polyps early can help prevent this. What are the causes? The exact cause of colon polyps is not known. What increases the risk? The following factors may make you more likely to develop this condition:  Having a family history of colorectal cancer or colon polyps.  Being older than 57 years of age.  Being younger than 57 years of age and having a significant family history of colorectal cancer or colon polyps or a genetic condition that puts you at higher risk of getting colon polyps.  Having inflammatory bowel disease, such as ulcerative colitis or Crohn's disease.  Having certain conditions passed from parent to child (hereditary conditions), such as: ? Familial adenomatous polyposis (FAP). ? Lynch syndrome. ? Turcot syndrome. ? Peutz-Jeghers syndrome. ? MUTYH-associated polyposis (MAP).  Being overweight.  Certain lifestyle factors. These include smoking cigarettes, drinking too much alcohol, not getting enough exercise, and eating a diet that is high in fat and red meat and low in fiber.  Having had childhood cancer that was treated with radiation of the abdomen. What are the signs or symptoms? Many times, there are no symptoms. If you have symptoms, they may include:  Blood coming from the rectum during a bowel movement.  Blood in the stool (feces). The blood may be bright red or very dark in color.  Pain in the abdomen.  A change in bowel habits, such as constipation or diarrhea. How is this diagnosed? This condition is diagnosed with a colonoscopy. This is a procedure in which a lighted, flexible scope is inserted into the opening between the buttocks (anus) and then passed into the colon to examine the area. Polyps are sometimes found when a colonoscopy is done as part of routine cancer screening  tests. How is this treated? This condition is treated by removing any polyps that are found. Most polyps can be removed during a colonoscopy. Those polyps will then be tested for cancer. Additional treatment may be needed depending on the results of testing. Follow these instructions at home: Eating and drinking  Eat foods that are high in fiber, such as fruits, vegetables, and whole grains.  Eat foods that are high in calcium and vitamin D, such as milk, cheese, yogurt, eggs, liver, fish, and broccoli.  Limit foods that are high in fat, such as fried foods and desserts.  Limit the amount of red meat, precooked or cured meat, or other processed meat that you eat, such as hot dogs, sausages, bacon, or meat loaves.  Limit sugary drinks.   Lifestyle  Maintain a healthy weight, or lose weight if recommended by your health care provider.  Exercise every day or as told by your health care provider.  Do not use any products that contain nicotine or tobacco, such as cigarettes, e-cigarettes, and chewing tobacco. If you need help quitting, ask your health care provider.  Do not drink alcohol if: ? Your health care provider tells you not to drink. ? You are pregnant, may be pregnant, or are planning to become pregnant.  If you drink alcohol: ? Limit how much you use to:  0-1 drink a day for women.  0-2 drinks a day for men. ? Know how much alcohol is in your drink. In the [place] one drink equals one 12 oz bottle of beer (355 mL), one 5 oz glass of wine (148 mL), or one 1 oz glass of hard liquor (44 mL). General instructions  Take over-the-counter and prescription medicines only as told by your health care  provider.  Keep all follow-up visits. This is important. This includes having regularly scheduled colonoscopies. Talk to your health care provider about when you need a colonoscopy. Contact a health care provider if:  You have new or worsening bleeding during a bowel movement.  You  have new or increased blood in your stool.  You have a change in bowel habits.  You lose weight for no known reason. Summary  Colon polyps are tissue growths inside the colon, which is part of the large intestine. They are one type of polyp that can grow in the body.  Most colon polyps are noncancerous (benign), but some can become cancerous over time.  This condition is diagnosed with a colonoscopy.  This condition is treated by removing any polyps that are found. Most polyps can be removed during a colonoscopy. This information is not intended to replace advice given to you by your health care provider. Make sure you discuss any questions you have with your health care provider. Document Revised: 12/21/2019 Document Reviewed: 12/21/2019 Elsevier Patient Education  2021 [place].  Colonoscopy, Adult, Care After This sheet gives you information about how to care for yourself after your procedure. Your doctor may also give you more specific instructions. If you have problems or questions, call your doctor. What can I expect after the procedure? After the procedure, it is common to have:  A small amount of blood in your poop (stool) for 24 hours.  Some gas.  Mild cramping or bloating in your belly (abdomen). Follow these instructions at home: Eating and drinking  Drink enough fluid to keep your pee (urine) pale yellow.  Follow instructions from your doctor about what you cannot eat or drink.  Return to your normal diet as told by your doctor. Avoid heavy or fried foods that are hard to digest.   Activity  Rest as told by your doctor.  Do not sit for a long time without moving. Get up to take short walks every 1-2 hours. This is important. Ask for help if you feel weak or unsteady.  Return to your normal activities as told by your doctor. Ask your doctor what activities are safe for you. To help cramping and bloating:  Try walking around.  Put heat on your belly as  told by your doctor. Use the heat source that your doctor recommends, such as a moist heat pack or a heating pad. ? Put a towel between your skin and the heat source. ? Leave the heat on for 20-30 minutes. ? Remove the heat if your skin turns bright red. This is very important if you are unable to feel pain, heat, or cold. You may have a greater risk of getting burned.   General instructions  If you were given a medicine to help you relax (sedative) during your procedure, it can affect you for many hours. Do not drive or use machinery until your doctor says that it is safe.  For the first 24 hours after the procedure: ? Do not sign important documents. ? Do not drink alcohol. ? Do your daily activities more slowly than normal. ? Eat foods that are soft and easy to digest.  Take over-the-counter or prescription medicines only as told by your doctor.  Keep all follow-up visits as told by your doctor. This is important. Contact a doctor if:  You have blood in your poop 2-3 days after the procedure. Get help right away if:  You have more than a small amount of  blood in your poop.  You see large clumps of tissue (blood clots) in your poop.  Your belly is swollen.  You feel like you may vomit (nauseous).  You vomit.  You have a fever.  You have belly pain that gets worse, and medicine does not help your pain. Summary  After the procedure, it is common to have a small amount of blood in your poop. You may also have mild cramping and bloating in your belly.  If you were given a medicine to help you relax (sedative) during your procedure, it can affect you for many hours. Do not drive or use machinery until your doctor says that it is safe.  Get help right away if you have a lot of blood in your poop, feel like you may vomit, have a fever, or have more belly pain. This information is not intended to replace advice given to you by your health care provider. Make sure you discuss any  questions you have with your health care provider. Document Revised: 07/08/2019 Document Reviewed: 03/28/2019 Elsevier Patient Education  [place].

## 2020-12-05 NOTE — Anesthesia Preprocedure Evaluation (Signed)
Anesthesia Evaluation  Patient identified by MRN, date of birth, ID band Patient awake    Reviewed: Allergy & Precautions, H&P , NPO status , Patient's Chart, lab work & pertinent test results, reviewed documented beta blocker date and time   Airway Mallampati: II  TM Distance: >3 FB Neck ROM: full    Dental no notable dental hx.    Pulmonary neg pulmonary ROS,    Pulmonary exam normal breath sounds clear to auscultation       Cardiovascular Exercise Tolerance: Good negative cardio ROS   Rhythm:regular Rate:Normal     Neuro/Psych negative neurological ROS  negative psych ROS   GI/Hepatic negative GI ROS, Neg liver ROS,   Endo/Other  negative endocrine ROS  Renal/GU negative Renal ROS  negative genitourinary   Musculoskeletal   Abdominal   Peds  Hematology negative hematology ROS (+)   Anesthesia Other Findings   Reproductive/Obstetrics negative OB ROS                             Anesthesia Physical Anesthesia Plan  ASA: II  Anesthesia Plan: General   Post-op Pain Management:    Induction:   PONV Risk Score and Plan:   Airway Management Planned:   Additional Equipment:   Intra-op Plan:   Post-operative Plan:   Informed Consent: I have reviewed the patients History and Physical, chart, labs and discussed the procedure including the risks, benefits and alternatives for the proposed anesthesia with the patient or authorized representative who has indicated his/her understanding and acceptance.     Dental Advisory Given  Plan Discussed with: CRNA  Anesthesia Plan Comments:         Anesthesia Quick Evaluation

## 2020-12-05 NOTE — Op Note (Signed)
[facility] Patient Name: Judy Bennett Procedure Date: 12/05/2020 9:37 AM MRN: [id number] Date of Birth: [birth year] Attending MD: Hildred Laser , MD CSN: [id number] Age: 55 Admit Type: Outpatient Procedure:                Colonoscopy Indications:              Abdominal pain, Constipation Providers:                Hildred Laser, MD, Gwenlyn Fudge, RN, Randa Spike, Technician Referring MD:              Medicines:                Propofol per Anesthesia Complications:            No immediate complications. Estimated Blood Loss:     Estimated blood loss was minimal. Procedure:                Pre-Anesthesia Assessment:                           - Prior to the procedure, a History and Physical                            was performed, and patient medications and                            allergies were reviewed. The patient's tolerance of                            previous anesthesia was also reviewed. The risks                            and benefits of the procedure and the sedation                            options and risks were discussed with the patient.                            All questions were answered, and informed consent                            was obtained. Prior Anticoagulants: The patient has                            taken no previous anticoagulant or antiplatelet                            agents. ASA Grade Assessment: I - A normal, healthy                            patient. After reviewing the risks and benefits,  the patient was deemed in satisfactory condition to                            undergo the procedure.                           After obtaining informed consent, the colonoscope                            was passed under direct vision. Throughout the                            procedure, the patient's blood pressure, pulse, and                            oxygen saturations were monitored  continuously. The                            [id number]) was introduced through the anus                            and advanced to the the cecum, identified by                            appendiceal orifice and ileocecal valve. The                            colonoscopy was somewhat difficult due to a                            redundant colon. The patient tolerated the                            procedure well. The quality of the bowel                            preparation was good. The ileocecal valve,                            appendiceal orifice, and rectum were photographed. Scope In: 9:39:11 AM Scope Out: 9:59:44 AM Scope Withdrawal Time: 0 hours 7 minutes 28 seconds  Total Procedure Duration: 0 hours 20 minutes 33 seconds  Findings:      The perianal and digital rectal examinations were normal.      Scattered diverticula were found in the entire colon.      A 4 mm polyp was found in the distal sigmoid colon. The polyp was       removed with a cold snare. Resection and retrieval were complete.      External hemorrhoids were found during retroflexion. The hemorrhoids       were small.      Anal papilla(e) were hypertrophied. Impression:               - Diverticulosis in the entire examined colon.                           -  One 4 mm polyp in the distal sigmoid colon,                            removed with a cold snare. Resected and retrieved.                           - External hemorrhoids.                           - Anal papilla(e) were hypertrophied. Moderate Sedation:      Per Anesthesia Care Recommendation:           - Patient has a contact number available for                            emergencies. The signs and symptoms of potential                            delayed complications were discussed with the                            patient. Return to normal activities tomorrow.                            Written discharge instructions were provided to the                             patient.                           - High fiber diet today.                           - Continue present medications.                           - Await pathology results.                           - Repeat colonoscopy for surveillance based on                            pathology results. Procedure Code(s):        --- Professional ---                           [phone], Colonoscopy, flexible; with removal of                            tumor(s), polyp(s), or other lesion(s) by snare                            technique Diagnosis Code(s):        --- Professional ---                           K64.4, Residual hemorrhoidal skin tags  K63.5, Polyp of colon                           K62.89, Other specified diseases of anus and rectum                           R10.9, Unspecified abdominal pain                           K59.00, Constipation, unspecified                           K57.30, Diverticulosis of large intestine without                            perforation or abscess without bleeding CPT copyright 2019 American [employer] Association. All rights reserved. The codes documented in this report are preliminary and upon coder review may  be revised to meet current compliance requirements. Hildred Laser, MD Hildred Laser, MD 12/05/2020 10:14:59 AM This report has been signed electronically. Number of Addenda: 0

## 2020-12-06 LAB — SURGICAL PATHOLOGY

## 2020-12-10 ENCOUNTER — Encounter (INDEPENDENT_AMBULATORY_CARE_PROVIDER_SITE_OTHER): Payer: Self-pay | Admitting: *Deleted

## 2020-12-12 ENCOUNTER — Encounter (HOSPITAL_COMMUNITY): Payer: Self-pay | Admitting: Internal Medicine

## 2020-12-31 ENCOUNTER — Ambulatory Visit (INDEPENDENT_AMBULATORY_CARE_PROVIDER_SITE_OTHER): Payer: Self-pay | Admitting: Gastroenterology

## 2021-04-26 ENCOUNTER — Ambulatory Visit
Admission: EM | Admit: 2021-04-26 | Discharge: 2021-04-26 | Disposition: A | Discharge status: Home / Self Care | Payer: 59 | Attending: Emergency Medicine | Admitting: Emergency Medicine

## 2021-04-26 ENCOUNTER — Other Ambulatory Visit: Payer: Self-pay

## 2021-04-26 DIAGNOSIS — L237 Allergic contact dermatitis due to plants, except food: Secondary | ICD-10-CM | POA: Diagnosis not present

## 2021-04-26 MED ORDER — PREDNISONE 10 MG (21) PO TBPK: ORAL_TABLET | Freq: Every day | ORAL | 0 refills | Status: DC

## 2021-04-26 NOTE — ED Triage Notes (Signed)
Pt presents with rash that's on arms and legs that developed 3 days ago

## 2021-04-26 NOTE — ED Provider Notes (Signed)
[place]   [id number] 04/26/21 Arrival Time: 1106  CC: Rash  SUBJECTIVE:  Judy Bennett is a 55 y.o. female who presents with a rash x 3 days ago.  Reports poison ivy/ oak exposure.  Localizes the rash to extremities and torso.  Describes it as redness, and swelling.  Has tried OTC medication without relief.  Symptoms are made worse with scratching.  Reports similar symptoms in the past that improved with prednisone.   Denies fever, chills, nausea, vomiting, discharge, oral lesions, SOB, chest pain.  ROS: As per HPI.  All other pertinent ROS negative.     No past medical history on file. Past Surgical History:  Procedure Laterality Date   CESAREAN SECTION     37 years ago   COLONOSCOPY WITH PROPOFOL N/A 12/05/2020   Procedure: COLONOSCOPY WITH PROPOFOL;  Surgeon: Rogene Houston, MD;  [facility];  Service: Endoscopy;  Laterality: N/A;  AM/ patient was positive for covid 10/29/20   ESOPHAGOGASTRODUODENOSCOPY (EGD) WITH PROPOFOL N/A 12/05/2020   Procedure: ESOPHAGOGASTRODUODENOSCOPY (EGD) WITH PROPOFOL;  Surgeon: Rogene Houston, MD;  [facility];  Service: Endoscopy;  Laterality: N/A;   POLYPECTOMY  12/05/2020   Procedure: POLYPECTOMY;  Surgeon: Rogene Houston, MD;  [facility];  Service: Endoscopy;;   No Known Allergies No current facility-administered medications on file prior to encounter.   Current Outpatient Medications on File Prior to Encounter  Medication Sig Dispense Refill   acetaminophen (TYLENOL) 500 MG tablet Take 1,000 mg by mouth every 6 (six) hours as needed for moderate pain or headache.     pantoprazole (PROTONIX) 40 MG tablet Take 1 tablet (40 mg total) by mouth daily before breakfast. 30 tablet 5   psyllium (METAMUCIL SMOOTH TEXTURE) 58.6 % powder Take 1 packet by mouth at bedtime. (Patient not taking: Reported on 11/15/2020)     Social History   Socioeconomic History   Marital status: Divorced    Spouse  name: Not on file   Number of children: Not on file   Years of education: Not on file   Highest education level: Not on file  Occupational History   Not on file  Tobacco Use   Smoking status: Never   Smokeless tobacco: Never  Vaping Use   Vaping Use: Never used  Substance and Sexual Activity   Alcohol use: Never   Drug use: Not Currently   Sexual activity: Not on file  Other Topics Concern   Not on file  Social History Narrative   Not on file   Social Determinants of Health   Financial Resource Strain: Not on file  Food Insecurity: Not on file  Transportation Needs: Not on file  Physical Activity: Not on file  Stress: Not on file  Social Connections: Not on file  Intimate Partner Violence: Not on file   Family History  Problem Relation Age of Onset   Kidney disease Mother    Diabetes Father    Thyroid disease Sister    Healthy Sister     OBJECTIVE: Vitals:   04/26/21 1130  BP: 136/84  Pulse: 68  Resp: 20  Temp: 98.3 F (36.8 C)  SpO2: 97%    General appearance: alert; no distress Head: NCAT Lungs: normal respiratory effort Extremities: no edema Skin: warm and dry; areas of linear papules and vesicles with surrounding erythema to extremities and trunk Psychological: alert and cooperative; normal mood and affect  ASSESSMENT & PLAN:  1. Poison ivy dermatitis  Meds ordered this encounter  Medications   predniSONE (STERAPRED UNI-PAK 21 TAB) 10 MG (21) TBPK tablet    Sig: Take by mouth daily. Take 6 tabs by mouth daily  for 2 days, then 5 tabs for 2 days, then 4 tabs for 2 days, then 3 tabs for 2 days, 2 tabs for 2 days, then 1 tab by mouth daily for 2 days    Dispense:  42 tablet    Refill:  0    Order Specific Question:   Supervising Provider    Answer:   Raylene Everts [id number]    Wash with warm water and mild soap Prednisone prescribed.  Take as directed and to completion Use OTC zyrtec, allegra, or claritin during the day.  Benadryl at  night. You may also use OTC hydrocortisone cream and/or calamine lotion to help alleviate itching Follow up with PCP if symptoms persists  Return or go to the ED if you have any new or worsening symptoms such as fever, chills, nausea, vomiting, difficulty breathing, throat swelling, tongue swelling, numbness/ tingling in mouth, worsening symptoms despite treatment, etc...   Reviewed expectations re: course of current medical issues. Questions answered. Outlined signs and symptoms indicating need for more acute intervention. Patient verbalized understanding. After Visit Summary given.    Lestine Box, PA-C 04/26/21 1158

## 2021-04-26 NOTE — Discharge Instructions (Addendum)
Wash with warm water and mild soap Prednisone prescribed.  Take as directed and to completion Use OTC zyrtec, allegra, or claritin during the day.  Benadryl at night. You may also use OTC hydrocortisone cream and/or calamine lotion to help alleviate itching Follow up with PCP if symptoms persists  Return or go to the ED if you have any new or worsening symptoms such as fever, chills, nausea, vomiting, difficulty breathing, throat swelling, tongue swelling, numbness/ tingling in mouth, worsening symptoms despite treatment, etc..Marland Kitchen

## 2021-07-05 ENCOUNTER — Other Ambulatory Visit: Payer: Self-pay

## 2021-07-05 ENCOUNTER — Encounter: Payer: Self-pay | Admitting: Emergency Medicine

## 2021-07-05 ENCOUNTER — Ambulatory Visit
Admission: EM | Admit: 2021-07-05 | Discharge: 2021-07-05 | Disposition: A | Discharge status: Home / Self Care | Payer: 59 | Attending: Family Medicine | Admitting: Family Medicine

## 2021-07-05 DIAGNOSIS — L237 Allergic contact dermatitis due to plants, except food: Secondary | ICD-10-CM

## 2021-07-05 MED ORDER — TRIAMCINOLONE ACETONIDE 0.1 % EX CREA: 1.0000 "application " | TOPICAL_CREAM | Freq: Two times a day (BID) | CUTANEOUS | 0 refills | Status: AC

## 2021-07-05 MED ORDER — PREDNISONE 10 MG (21) PO TBPK: ORAL_TABLET | Freq: Every day | ORAL | 0 refills | Status: DC

## 2021-07-05 NOTE — Discharge Instructions (Signed)
Try the triamcinolone cream before starting the prednisone as the areas are quite localized. Take zyrtec 1-2 times daily additionally if possible. Avoid scratching.

## 2021-07-05 NOTE — ED Triage Notes (Signed)
Pt is present today with possible poison ivy. Pt states that she noticed the rash Monday

## 2021-07-05 NOTE — ED Provider Notes (Signed)
[facility]    CSN: [id number] Arrival date & time: 07/05/21  1457      History   Chief Complaint Chief Complaint  Patient presents with   Poison Ivy    HPI Judy Bennett is a 55 y.o. female.   Patient presenting today with itchy rash x4 days that started on her neck and is now in small patches on hands, lower leg on the left.  States she thinks she got into some poison ivy or poison oak recently.  She denies difficulty breathing, throat scratching or swelling, nausea, vomiting, diarrhea.  Tried some anti-itch cream but states that this provided no relief.   History reviewed. No pertinent past medical history.  Patient Active Problem List   Diagnosis Date Noted   Melena 10/11/2020   Abdominal pain 10/11/2020   Constipation 10/11/2020    Past Surgical History:  Procedure Laterality Date   CESAREAN SECTION     37 years ago   COLONOSCOPY WITH PROPOFOL N/A 12/05/2020   Procedure: COLONOSCOPY WITH PROPOFOL;  Surgeon: Rogene Houston, MD;  [facility];  Service: Endoscopy;  Laterality: N/A;  AM/ patient was positive for covid 10/29/20   ESOPHAGOGASTRODUODENOSCOPY (EGD) WITH PROPOFOL N/A 12/05/2020   Procedure: ESOPHAGOGASTRODUODENOSCOPY (EGD) WITH PROPOFOL;  Surgeon: Rogene Houston, MD;  [facility];  Service: Endoscopy;  Laterality: N/A;   POLYPECTOMY  12/05/2020   Procedure: POLYPECTOMY;  Surgeon: Rogene Houston, MD;  [facility];  Service: Endoscopy;;    OB History   No obstetric history on file.      Home Medications    Prior to Admission medications   Medication Sig Start Date End Date Taking? Authorizing Provider  triamcinolone cream (KENALOG) 0.1 % Apply 1 application topically 2 (two) times daily. 07/05/21  Yes Volney American, PA-C  acetaminophen (TYLENOL) 500 MG tablet Take 1,000 mg by mouth every 6 (six) hours as needed for moderate pain or headache.    [provider]  pantoprazole  (PROTONIX) 40 MG tablet Take 1 tablet (40 mg total) by mouth daily before breakfast. 12/05/20   Rehman, Mechele Dawley, MD  predniSONE (STERAPRED UNI-PAK 21 TAB) 10 MG (21) TBPK tablet Take by mouth daily. Take 6 tabs by mouth daily  for 2 days, then 5 tabs for 2 days, then 4 tabs for 2 days, then 3 tabs for 2 days, 2 tabs for 2 days, then 1 tab by mouth daily for 2 days 07/05/21   Volney American, PA-C  psyllium (METAMUCIL SMOOTH TEXTURE) 58.6 % powder Take 1 packet by mouth at bedtime. Patient not taking: Reported on 11/15/2020 10/11/20   Rogene Houston, MD    Family History Family History  Problem Relation Age of Onset   Kidney disease Mother    Diabetes Father    Thyroid disease Sister    Healthy Sister     Social History Social History   Tobacco Use   Smoking status: Never   Smokeless tobacco: Never  Vaping Use   Vaping Use: Never used  Substance Use Topics   Alcohol use: Never   Drug use: Not Currently     Allergies   Patient has no known allergies.   Review of Systems Review of Systems Per HPI  Physical Exam Triage Vital Signs ED Triage Vitals  Enc Vitals Group     BP 07/05/21 1505 111/66     Pulse Rate 07/05/21 1505 69     Resp 07/05/21  1505 18     Temp 07/05/21 1505 98.5 F (36.9 C)     Temp src --      SpO2 07/05/21 1505 98 %     Weight --      Height --      Head Circumference --      Peak Flow --      Pain Score 07/05/21 1504 0     Pain Loc --      Pain Edu? --      Excl. in [place]? --    No data found.  Updated Vital Signs BP 111/66   Pulse 69   Temp 98.5 F (36.9 C)   Resp 18   LMP  (LMP Unknown)   SpO2 98%   Visual Acuity Right Eye Distance:   Left Eye Distance:   Bilateral Distance:    Right Eye Near:   Left Eye Near:    Bilateral Near:     Physical Exam Vitals and nursing note reviewed.  Constitutional:      Appearance: Normal appearance. She is not ill-appearing.  HENT:     Head: Atraumatic.  Eyes:     Extraocular  Movements: Extraocular movements intact.     Conjunctiva/sclera: Conjunctivae normal.  Cardiovascular:     Rate and Rhythm: Normal rate and regular rhythm.     Heart sounds: Normal heart sounds.  Pulmonary:     Effort: Pulmonary effort is normal.     Breath sounds: Normal breath sounds.  Musculoskeletal:        General: Normal range of motion.     Cervical back: Normal range of motion and neck supple.  Skin:    General: Skin is warm.     Findings: Rash present.     Comments: Small linear erythematous maculopapular rash present anterior neck, back of right hand, left lower anterior leg  Neurological:     Mental Status: She is alert and oriented to person, place, and time.  Psychiatric:        Mood and Affect: Mood normal.        Thought Content: Thought content normal.        Judgment: Judgment normal.     UC Treatments / Results  Labs (all labs ordered are listed, but only abnormal results are displayed) Labs Reviewed - No data to display  EKG   Radiology No results found.  Procedures Procedures (including critical care time)  Medications Ordered in UC Medications - No data to display  Initial Impression / Assessment and Plan / UC Course  I have reviewed the triage vital signs and the nursing notes.  Pertinent labs & imaging results that were available during my care of the patient were reviewed by me and considered in my medical decision making (see chart for details).     Discussed given small localized areas of rash that this could likely be well controlled with triamcinolone cream, antihistamines.  Patient states that she always requires oral prednisone for this type of rash and is adamant that she needs this so we will send in but discussed to try the cream first to hopefully avoid significant systemic steroid use for such a localized rash.  Antihistamines twice daily recommended additionally  Final Clinical Impressions(s) / UC Diagnoses   Final diagnoses:   Allergic contact dermatitis due to plants, except food     Discharge Instructions      Try the triamcinolone cream before starting the prednisone as the areas are quite localized.  Take zyrtec 1-2 times daily additionally if possible. Avoid scratching.    ED Prescriptions     Medication Sig Dispense Auth. Provider   triamcinolone cream (KENALOG) 0.1 % Apply 1 application topically 2 (two) times daily. 80 g Volney American, [place]   predniSONE (STERAPRED UNI-PAK 21 TAB) 10 MG (21) TBPK tablet Take by mouth daily. Take 6 tabs by mouth daily  for 2 days, then 5 tabs for 2 days, then 4 tabs for 2 days, then 3 tabs for 2 days, 2 tabs for 2 days, then 1 tab by mouth daily for 2 days 42 tablet Volney American, [place]      PDMP not reviewed this encounter.   Volney American, [place] 07/05/21 1610

## 2021-08-09 ENCOUNTER — Ambulatory Visit
Admission: EM | Admit: 2021-08-09 | Discharge: 2021-08-09 | Disposition: A | Discharge status: Home / Self Care | Payer: 59 | Attending: Urgent Care | Admitting: Urgent Care

## 2021-08-09 ENCOUNTER — Encounter: Payer: Self-pay | Admitting: Emergency Medicine

## 2021-08-09 ENCOUNTER — Other Ambulatory Visit: Payer: Self-pay

## 2021-08-09 DIAGNOSIS — H6981 Other specified disorders of Eustachian tube, right ear: Secondary | ICD-10-CM | POA: Diagnosis not present

## 2021-08-09 DIAGNOSIS — H9201 Otalgia, right ear: Secondary | ICD-10-CM

## 2021-08-09 MED ORDER — PSEUDOEPHEDRINE HCL 60 MG PO TABS: 60.0000 mg | ORAL_TABLET | Freq: Three times a day (TID) | ORAL | 0 refills | Status: DC | PRN

## 2021-08-09 MED ORDER — AMOXICILLIN 875 MG PO TABS: 875.0000 mg | ORAL_TABLET | Freq: Two times a day (BID) | ORAL | 0 refills | Status: DC

## 2021-08-09 MED ORDER — CETIRIZINE HCL 10 MG PO TABS: 10.0000 mg | ORAL_TABLET | Freq: Every day | ORAL | 0 refills | Status: AC

## 2021-08-09 NOTE — ED Provider Notes (Signed)
[place]   MRN: [id number] DOB: 21-Apr-1966  Subjective:   Judy Bennett is a 55 y.o. female presenting for 3 to 4-day history of acute onset right ear pain, generalized headache.  The headache has been improved but she continues to have right ear pain.  No dizziness, tinnitus, ear drainage, runny or stuffy nose, cough, sore throat.  No current facility-administered medications for this encounter.  Current Outpatient Medications:    acetaminophen (TYLENOL) 500 MG tablet, Take 1,000 mg by mouth every 6 (six) hours as needed for moderate pain or headache., Disp: , Rfl:    pantoprazole (PROTONIX) 40 MG tablet, Take 1 tablet (40 mg total) by mouth daily before breakfast., Disp: 30 tablet, Rfl: 5   predniSONE (STERAPRED UNI-PAK 21 TAB) 10 MG (21) TBPK tablet, Take by mouth daily. Take 6 tabs by mouth daily  for 2 days, then 5 tabs for 2 days, then 4 tabs for 2 days, then 3 tabs for 2 days, 2 tabs for 2 days, then 1 tab by mouth daily for 2 days, Disp: 42 tablet, Rfl: 0   psyllium (METAMUCIL SMOOTH TEXTURE) 58.6 % powder, Take 1 packet by mouth at bedtime. (Patient not taking: Reported on 11/15/2020), Disp: , Rfl:    triamcinolone cream (KENALOG) 0.1 %, Apply 1 application topically 2 (two) times daily., Disp: 80 g, Rfl: 0   No Known Allergies  No past medical history on file.   Past Surgical History:  Procedure Laterality Date   CESAREAN SECTION     37 years ago   COLONOSCOPY WITH PROPOFOL N/A 12/05/2020   Procedure: COLONOSCOPY WITH PROPOFOL;  Surgeon: Rogene Houston, MD;  [facility];  Service: Endoscopy;  Laterality: N/A;  AM/ patient was positive for covid 10/29/20   ESOPHAGOGASTRODUODENOSCOPY (EGD) WITH PROPOFOL N/A 12/05/2020   Procedure: ESOPHAGOGASTRODUODENOSCOPY (EGD) WITH PROPOFOL;  Surgeon: Rogene Houston, MD;  [facility];  Service: Endoscopy;  Laterality: N/A;   POLYPECTOMY  12/05/2020   Procedure: POLYPECTOMY;  Surgeon: Rogene Houston, MD;  [facility];  Service: Endoscopy;;    Family History  Problem Relation Age of Onset   Kidney disease Mother    Diabetes Father    Thyroid disease Sister    Healthy Sister     Social History   Tobacco Use   Smoking status: Never   Smokeless tobacco: Never  Vaping Use   Vaping Use: Never used  Substance Use Topics   Alcohol use: Never   Drug use: Not Currently    ROS   Objective:   Vitals: BP 125/76   Pulse 62   Temp 98.6 F (37 C) (Oral)   Resp 16   LMP  (LMP Unknown)   SpO2 98%   Physical Exam Constitutional:      General: She is not in acute distress.    Appearance: She is well-developed. She is not ill-appearing, toxic-appearing or diaphoretic.  HENT:     Head: Normocephalic and atraumatic.     Right Ear: Tympanic membrane, ear canal and external ear normal. No drainage or tenderness. No middle ear effusion. There is no impacted cerumen. Tympanic membrane is not erythematous.     Left Ear: Tympanic membrane, ear canal and external ear normal. No drainage or tenderness.  No middle ear effusion. There is no impacted cerumen. Tympanic membrane is not erythematous.     Nose: No congestion or rhinorrhea.     Mouth/Throat:     Mouth: Mucous  membranes are moist. No oral lesions.     Pharynx: Oropharynx is clear. No pharyngeal swelling, oropharyngeal exudate, posterior oropharyngeal erythema or uvula swelling.     Tonsils: No tonsillar exudate or tonsillar abscesses.  Eyes:     Extraocular Movements:     Right eye: Normal extraocular motion.     Left eye: Normal extraocular motion.     Conjunctiva/sclera: Conjunctivae normal.     Pupils: Pupils are equal, round, and reactive to light.  Cardiovascular:     Rate and Rhythm: Normal rate.  Pulmonary:     Effort: Pulmonary effort is normal.  Musculoskeletal:     Cervical back: Normal range of motion and neck supple.  Lymphadenopathy:     Cervical: No cervical adenopathy.  Skin:     General: Skin is warm and dry.  Neurological:     General: No focal deficit present.     Mental Status: She is alert and oriented to person, place, and time.  Psychiatric:        Mood and Affect: Mood normal.        Behavior: Behavior normal.    Assessment and Plan :   PDMP not reviewed this encounter.  1. Eustachian tube dysfunction, right   2. Right ear pain    Unremarkable ENT exam.  Will use conservative management for what I suspect is eustachian tube dysfunction.  Recommended starting Zyrtec, Sudafed.  Offered her prescription for amoxicillin should her symptoms fail to improve with her current treatment plan, she is to start this in 48 to 72 hours.  Recheck thereafter.  Counseled patient on potential for adverse effects with medications prescribed/recommended today, ER and return-to-clinic precautions discussed, patient verbalized understanding.     Jaynee Eagles, PA-C 08/10/21 1006

## 2021-08-09 NOTE — ED Triage Notes (Signed)
PT reports headache and right ear pain for 3-4 days.

## 2022-06-18 ENCOUNTER — Ambulatory Visit
Admission: EM | Admit: 2022-06-18 | Discharge: 2022-06-18 | Disposition: A | Discharge status: Home / Self Care | Payer: 59 | Attending: Nurse Practitioner | Admitting: Nurse Practitioner

## 2022-06-18 DIAGNOSIS — R21 Rash and other nonspecific skin eruption: Secondary | ICD-10-CM

## 2022-06-18 MED ORDER — PREDNISONE 10 MG (21) PO TBPK: ORAL_TABLET | Freq: Every day | ORAL | 0 refills | Status: AC

## 2022-06-18 NOTE — Discharge Instructions (Addendum)
Please start on an oral antihistamine twice daily to help with the itching.  You can also use the triamcinolone cream twice daily as needed for itching.  If this does not help symptoms, start the prednisone and take it as prescribed.  Return to see [place] or primary care provider with persistent or worsening symptoms.

## 2022-06-18 NOTE — ED Triage Notes (Signed)
Pt reports itching rash in leg x 2 days.

## 2022-06-18 NOTE — ED Provider Notes (Signed)
[facility]    CSN: [id number] Arrival date & time: 06/18/22  0906      History   Chief Complaint Chief Complaint  Patient presents with   Rash    HPI Judy Bennett is a 56 y.o. female.   Patient presents for red itchy bumps on her left upper leg that is spreading to her abdomen and right upper leg.  She reports they are itchy and red.  Reports she noticed some after cutting down a tree in her yard a few days ago.  She denies any pain, blisters, scaling, oozing or drainage from the red bumps.  No recent change in detergents, soaps, or personal care products.  No throat or tongue swelling, throat itching, shortness of breath, or new muscle pain/joint aches.  Has tried lotion without much relief.  Reports she gets like this every year and has to be treated with prednisone for it to get better.    History reviewed. No pertinent past medical history.  Patient Active Problem List   Diagnosis Date Noted   Melena 10/11/2020   Abdominal pain 10/11/2020   Constipation 10/11/2020    Past Surgical History:  Procedure Laterality Date   CESAREAN SECTION     37 years ago   COLONOSCOPY WITH PROPOFOL N/A 12/05/2020   Procedure: COLONOSCOPY WITH PROPOFOL;  Surgeon: Rogene Houston, MD;  [facility];  Service: Endoscopy;  Laterality: N/A;  AM/ patient was positive for covid 10/29/20   ESOPHAGOGASTRODUODENOSCOPY (EGD) WITH PROPOFOL N/A 12/05/2020   Procedure: ESOPHAGOGASTRODUODENOSCOPY (EGD) WITH PROPOFOL;  Surgeon: Rogene Houston, MD;  [facility];  Service: Endoscopy;  Laterality: N/A;   POLYPECTOMY  12/05/2020   Procedure: POLYPECTOMY;  Surgeon: Rogene Houston, MD;  [facility];  Service: Endoscopy;;    OB History   No obstetric history on file.      Home Medications    Prior to Admission medications   Medication Sig Start Date End Date Taking? Authorizing Provider  acetaminophen (TYLENOL) 500 MG tablet Take 1,000 mg by  mouth every 6 (six) hours as needed for moderate pain or headache.    [provider]  cetirizine (ZYRTEC ALLERGY) 10 MG tablet Take 1 tablet (10 mg total) by mouth daily. 08/09/21   Jaynee Eagles, PA-C  pantoprazole (PROTONIX) 40 MG tablet Take 1 tablet (40 mg total) by mouth daily before breakfast. 12/05/20   Rehman, Mechele Dawley, MD  predniSONE (STERAPRED UNI-PAK 21 TAB) 10 MG (21) TBPK tablet Take by mouth daily. Take 6 tabs by mouth daily  for 2 days, then 5 tabs for 2 days, then 4 tabs for 2 days, then 3 tabs for 2 days, 2 tabs for 2 days, then 1 tab by mouth daily for 2 days 06/18/22   Eulogio Bear, NP  psyllium (METAMUCIL SMOOTH TEXTURE) 58.6 % powder Take 1 packet by mouth at bedtime. Patient not taking: Reported on 11/15/2020 10/11/20   Rogene Houston, MD  triamcinolone cream (KENALOG) 0.1 % Apply 1 application topically 2 (two) times daily. 07/05/21   Volney American, PA-C    Family History Family History  Problem Relation Age of Onset   Kidney disease Mother    Diabetes Father    Thyroid disease Sister    Healthy Sister     Social History Social History   Tobacco Use   Smoking status: Never   Smokeless tobacco: Never  Vaping Use   Vaping Use: Never used  Substance Use Topics   Alcohol use: Never   Drug use: Not Currently     Allergies   Patient has no known allergies.   Review of Systems Review of Systems Per HPI  Physical Exam Triage Vital Signs ED Triage Vitals  Enc Vitals Group     BP 06/18/22 0917 129/76     Pulse Rate 06/18/22 0917 60     Resp 06/18/22 0917 16     Temp 06/18/22 0917 98.3 F (36.8 C)     Temp Source 06/18/22 0917 Oral     SpO2 06/18/22 0917 95 %     Weight --      Height --      Head Circumference --      Peak Flow --      Pain Score 06/18/22 0918 0     Pain Loc --      Pain Edu? --      Excl. in [place]? --    No data found.  Updated Vital Signs BP 129/76 (BP Location: Right Arm)   Pulse 60   Temp 98.3 F  (36.8 C) (Oral)   Resp 16   LMP  (LMP Unknown)   SpO2 95%   Visual Acuity Right Eye Distance:   Left Eye Distance:   Bilateral Distance:    Right Eye Near:   Left Eye Near:    Bilateral Near:     Physical Exam Vitals and nursing note reviewed.  Constitutional:      General: She is not in acute distress.    Appearance: Normal appearance. She is not toxic-appearing.  HENT:     Mouth/Throat:     Mouth: Mucous membranes are moist.     Pharynx: Oropharynx is clear.  Pulmonary:     Effort: Pulmonary effort is normal. No respiratory distress.  Skin:    General: Skin is warm and dry.     Capillary Refill: Capillary refill takes less than 2 seconds.     Coloration: Skin is not jaundiced or pale.     Findings: Rash present. No erythema. Rash is papular.     Comments: Supple, distinct erythematous, papular lesions to upper left thigh, groin, right groin, and anterior abdomen.  No surrounding erythema, fluctuance, warmth, active drainage.  Neurological:     Mental Status: She is alert and oriented to person, place, and time.  Psychiatric:        Behavior: Behavior is cooperative.      UC Treatments / Results  Labs (all labs ordered are listed, but only abnormal results are displayed) Labs Reviewed - No data to display  EKG   Radiology No results found.  Procedures Procedures (including critical care time)  Medications Ordered in UC Medications - No data to display  Initial Impression / Assessment and Plan / UC Course  I have reviewed the triage vital signs and the nursing notes.  Pertinent labs & imaging results that were available during my care of the patient were reviewed by me and considered in my medical decision making (see chart for details).    Patient is well-appearing, normotensive, afebrile, not tachycardic, not tachypneic, oxygenating well on room air.  Rash consistent with possible chigger bites versus allergic contact dermatitis from plant oil.  At end  of visit, patient tells me she is already tried triamcinolone cream with no benefit.  Given extent of rash, will start on oral prednisone, however discussed long-term side effects with patient including elevated blood sugar, osteoporosis, etc. ER  and return precautions discussed.  The patient was given the opportunity to ask questions.  All questions answered to their satisfaction.  The patient is in agreement to this plan.    Final Clinical Impressions(s) / UC Diagnoses   Final diagnoses:  Rash and nonspecific skin eruption     Discharge Instructions      Please start on an oral antihistamine twice daily to help with the itching.  You can also use the triamcinolone cream twice daily as needed for itching.  If this does not help symptoms, start the prednisone and take it as prescribed.  Return to see [place] or primary care provider with persistent or worsening symptoms.    ED Prescriptions     Medication Sig Dispense Auth. Provider   predniSONE (STERAPRED UNI-PAK 21 TAB) 10 MG (21) TBPK tablet Take by mouth daily. Take 6 tabs by mouth daily  for 2 days, then 5 tabs for 2 days, then 4 tabs for 2 days, then 3 tabs for 2 days, 2 tabs for 2 days, then 1 tab by mouth daily for 2 days 42 tablet Eulogio Bear, NP      PDMP not reviewed this encounter.   Eulogio Bear, NP 06/18/22 [phone]

## 2022-07-07 ENCOUNTER — Ambulatory Visit
Admission: EM | Admit: 2022-07-07 | Discharge: 2022-07-07 | Disposition: A | Discharge status: Home / Self Care | Payer: 59 | Attending: Nurse Practitioner | Admitting: Nurse Practitioner

## 2022-07-07 DIAGNOSIS — W57XXXA Bitten or stung by nonvenomous insect and other nonvenomous arthropods, initial encounter: Secondary | ICD-10-CM

## 2022-07-07 DIAGNOSIS — S30861A Insect bite (nonvenomous) of abdominal wall, initial encounter: Secondary | ICD-10-CM | POA: Diagnosis not present

## 2022-07-07 MED ORDER — DOXYCYCLINE HYCLATE 100 MG PO TABS: 100.0000 mg | ORAL_TABLET | Freq: Two times a day (BID) | ORAL | 0 refills | Status: AC

## 2022-07-07 NOTE — ED Provider Notes (Signed)
[facility]    CSN: [id number] Arrival date & time: 07/07/22  1422      History   Chief Complaint Chief Complaint  Patient presents with   Tick Removal    HPI Judy Bennett is a 56 y.o. female.   The history is provided by the patient.   The patient presents for complaints of a tick to the right lower abdomen that she saw last night.  Patient states that she tried to remove the tick, but the head remains stuck in her right lower abdomen.  She states that most likely her dog brought the tick inside.  She is unsure of how long the tick was attached.  She states that the tick was also engorged.  She denies fever, chills, rash, chest pain, abdominal pain, fatigue, nausea, vomiting, or diarrhea.  Patient states she tried to remove the tick with out success.  History reviewed. No pertinent past medical history.  Patient Active Problem List   Diagnosis Date Noted   Melena 10/11/2020   Abdominal pain 10/11/2020   Constipation 10/11/2020    Past Surgical History:  Procedure Laterality Date   CESAREAN SECTION     37 years ago   COLONOSCOPY WITH PROPOFOL N/A 12/05/2020   Procedure: COLONOSCOPY WITH PROPOFOL;  Surgeon: Rogene Houston, MD;  [facility];  Service: Endoscopy;  Laterality: N/A;  AM/ patient was positive for covid 10/29/20   ESOPHAGOGASTRODUODENOSCOPY (EGD) WITH PROPOFOL N/A 12/05/2020   Procedure: ESOPHAGOGASTRODUODENOSCOPY (EGD) WITH PROPOFOL;  Surgeon: Rogene Houston, MD;  [facility];  Service: Endoscopy;  Laterality: N/A;   POLYPECTOMY  12/05/2020   Procedure: POLYPECTOMY;  Surgeon: Rogene Houston, MD;  [facility];  Service: Endoscopy;;    OB History   No obstetric history on file.      Home Medications    Prior to Admission medications   Medication Sig Start Date End Date Taking? Authorizing Provider  doxycycline (VIBRA-TABS) 100 MG tablet Take 1 tablet (100 mg total) by mouth 2 (two) times daily  for 7 days. 07/07/22 07/14/22 Yes Dniyah Grant-Warren, Alda Lea, NP  acetaminophen (TYLENOL) 500 MG tablet Take 1,000 mg by mouth every 6 (six) hours as needed for moderate pain or headache.    [provider]  cetirizine (ZYRTEC ALLERGY) 10 MG tablet Take 1 tablet (10 mg total) by mouth daily. 08/09/21   Jaynee Eagles, PA-C  pantoprazole (PROTONIX) 40 MG tablet Take 1 tablet (40 mg total) by mouth daily before breakfast. 12/05/20   Rehman, Mechele Dawley, MD  predniSONE (STERAPRED UNI-PAK 21 TAB) 10 MG (21) TBPK tablet Take by mouth daily. Take 6 tabs by mouth daily  for 2 days, then 5 tabs for 2 days, then 4 tabs for 2 days, then 3 tabs for 2 days, 2 tabs for 2 days, then 1 tab by mouth daily for 2 days 06/18/22   Eulogio Bear, NP  psyllium (METAMUCIL SMOOTH TEXTURE) 58.6 % powder Take 1 packet by mouth at bedtime. Patient not taking: Reported on 11/15/2020 10/11/20   Rogene Houston, MD  triamcinolone cream (KENALOG) 0.1 % Apply 1 application topically 2 (two) times daily. 07/05/21   Volney American, PA-C    Family History Family History  Problem Relation Age of Onset   Kidney disease Mother    Diabetes Father    Thyroid disease Sister    Healthy Sister     Social History Social History   Tobacco Use  Smoking status: Never   Smokeless tobacco: Never  Vaping Use   Vaping Use: Never used  Substance Use Topics   Alcohol use: Never   Drug use: Not Currently     Allergies   Patient has no known allergies.   Review of Systems Review of Systems Per HPI  Physical Exam Triage Vital Signs ED Triage Vitals  Enc Vitals Group     BP 07/07/22 1440 (!) 171/80     Pulse Rate 07/07/22 1440 63     Resp 07/07/22 1440 18     Temp 07/07/22 1440 98.4 F (36.9 C)     Temp Source 07/07/22 1440 Oral     SpO2 07/07/22 1440 99 %     Weight --      Height --      Head Circumference --      Peak Flow --      Pain Score 07/07/22 1441 4     Pain Loc --      Pain Edu? --       Excl. in [place]? --    No data found.  Updated Vital Signs BP (!) 171/80 (BP Location: Right Arm)   Pulse 63   Temp 98.4 F (36.9 C) (Oral)   Resp 18   LMP  (LMP Unknown)   SpO2 99%   Visual Acuity Right Eye Distance:   Left Eye Distance:   Bilateral Distance:    Right Eye Near:   Left Eye Near:    Bilateral Near:     Physical Exam Vitals and nursing note reviewed.  Constitutional:      Appearance: Normal appearance.  Eyes:     Extraocular Movements: Extraocular movements intact.     Pupils: Pupils are equal, round, and reactive to light.  Pulmonary:     Effort: Pulmonary effort is normal.  Musculoskeletal:     Cervical back: Normal range of motion.  Skin:         Comments: Induration noted to the right lower abdomen with the presence of tick remnants.  Arachnoid legs that appear to be that from a tick are present to the induration.  Induration was previously disrupted by the patient as she was trying to remove the tick.  Area is tender.  There is an area with a central punctum present.  Neurological:     General: No focal deficit present.     Mental Status: She is alert and oriented to person, place, and time.  Psychiatric:        Mood and Affect: Mood normal.        Behavior: Behavior normal.      UC Treatments / Results  Labs (all labs ordered are listed, but only abnormal results are displayed) Labs Reviewed - No data to display  EKG   Radiology No results found.  Procedures Foreign Body Removal  Date/Time: 07/07/2022 4:50 PM  Performed by: Tish Men, NP Authorized by: Tish Men, NP   Consent:    Consent obtained:  Verbal   Consent given by:  Patient   Risks, benefits, and alternatives were discussed: yes     Risks discussed:  Bleeding, incomplete removal, infection, worsening of condition and pain Universal protocol:    Procedure explained and questions answered to patient or proxy's satisfaction: yes     Patient  identity confirmed:  Verbally with patient Location:    Location: Right lower abdomen. Anesthesia:    Anesthesia method:  None Procedure type:  Procedure complexity:  Simple Procedure details:    Localization method:  Visualized   Removal mechanism:  Forceps   Foreign bodies recovered: Remnants of the foreign object were removed.  Incomplete removal of the suspected tic is present.  Unable to perform complete removal due to increased risk for infection. Post-procedure details:    Confirmation:  Residual foreign bodies remain   Skin closure:  None   Dressing:  Adhesive bandage   Procedure completion:  Tolerated well, no immediate complications Comments:     Remnants of the suspected tick were removed with remains of the suspected tick present.  Advised patient that body will rid itself of the tick naturally.  Bandage was applied.  Patient tolerated well.  (including critical care time)  Medications Ordered in UC Medications - No data to display  Initial Impression / Assessment and Plan / UC Course  I have reviewed the triage vital signs and the nursing notes.  Pertinent labs & imaging results that were available during my care of the patient were reviewed by me and considered in my medical decision making (see chart for details).  Patient presents for tick removal that she noticed less than 24 hours ago.  Patient is unsure of how long the tick was in place to her right lower abdomen.  Attempted to remove the tick, remnants were removed, but portions of the tick remaining.  Patient was advised that the body will expel the tick naturally on its own.  Advised that continued attempted removal will put her at risk for increased infection.  Patient was started on doxycycline 100 mg as she does not know how long the tick was present.  Supportive care recommendations were provided to the patient.  Patient verbalizes understanding.  All questions were answered.  Patient is stable for  discharge. Final Clinical Impressions(s) / UC Diagnoses   Final diagnoses:  Tick bite of abdomen, initial encounter     Discharge Instructions      I was unable to remove the tick in its entirety.  Do not pick or continue to disrupt the area as the body will get rid of the tick on its own. Take medication as prescribed. Apply Neosporin to the site daily and cover with a bandage. Continue to monitor the site for any worsening to include foul-smelling drainage, worsening redness or streaking that goes across the abdomen, or other concerns.  If you notice any of the symptoms, please follow-up in this clinic immediately. May take ibuprofen or Tylenol as needed for pain or discomfort. Warm compresses to the area to help with pain or discomfort. Follow-up as needed.     ED Prescriptions     Medication Sig Dispense Auth. Provider   doxycycline (VIBRA-TABS) 100 MG tablet Take 1 tablet (100 mg total) by mouth 2 (two) times daily for 7 days. 14 tablet Dajanay Northrup-Warren, Alda Lea, NP      PDMP not reviewed this encounter.   Tish Men, NP 07/07/22 [phone]

## 2022-07-07 NOTE — ED Triage Notes (Signed)
insect bite (tick) on right lower hip/abdomen. Saw tick bite last night, patient tried to remove but unable to.

## 2022-07-07 NOTE — Discharge Instructions (Signed)
I was unable to remove the tick in its entirety.  Do not pick or continue to disrupt the area as the body will get rid of the tick on its own. Take medication as prescribed. Apply Neosporin to the site daily and cover with a bandage. Continue to monitor the site for any worsening to include foul-smelling drainage, worsening redness or streaking that goes across the abdomen, or other concerns.  If you notice any of the symptoms, please follow-up in this clinic immediately. May take ibuprofen or Tylenol as needed for pain or discomfort. Warm compresses to the area to help with pain or discomfort. Follow-up as needed.

## 2022-10-10 ENCOUNTER — Encounter: Payer: Self-pay | Admitting: Emergency Medicine

## 2022-10-10 ENCOUNTER — Ambulatory Visit
Admission: EM | Admit: 2022-10-10 | Discharge: 2022-10-10 | Disposition: A | Discharge status: Home / Self Care | Payer: 59

## 2022-10-10 DIAGNOSIS — J069 Acute upper respiratory infection, unspecified: Secondary | ICD-10-CM | POA: Diagnosis not present

## 2022-10-10 NOTE — ED Triage Notes (Signed)
Sore throat and body aches and fatigue since Tuesday.

## 2022-10-10 NOTE — ED Provider Notes (Signed)
[facility]    CSN: [id number] Arrival date & time: 10/10/22  0818      History   Chief Complaint No chief complaint on file.   HPI Judy Bennett is a 57 y.o. female.   Patient presenting today with 3-day history of sore throat, body aches, fatigue, sinus drainage, mild cough.  Denies chest pain, shortness of breath, abdominal pain, nausea vomiting or diarrhea.  Trying over-the-counter cold and congestion medications with no relief.  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  No known pertinent chronic medical problems.    History reviewed. No pertinent past medical history.  Patient Active Problem List   Diagnosis Date Noted   Melena 10/11/2020   Abdominal pain 10/11/2020   Constipation 10/11/2020    Past Surgical History:  Procedure Laterality Date   CESAREAN SECTION     37 years ago   COLONOSCOPY WITH PROPOFOL N/A 12/05/2020   Procedure: COLONOSCOPY WITH PROPOFOL;  Surgeon: Rogene Houston, MD;  [facility];  Service: Endoscopy;  Laterality: N/A;  AM/ patient was positive for covid 10/29/20   ESOPHAGOGASTRODUODENOSCOPY (EGD) WITH PROPOFOL N/A 12/05/2020   Procedure: ESOPHAGOGASTRODUODENOSCOPY (EGD) WITH PROPOFOL;  Surgeon: Rogene Houston, MD;  [facility];  Service: Endoscopy;  Laterality: N/A;   POLYPECTOMY  12/05/2020   Procedure: POLYPECTOMY;  Surgeon: Rogene Houston, MD;  [facility];  Service: Endoscopy;;    OB History   No obstetric history on file.      Home Medications    Prior to Admission medications   Medication Sig Start Date End Date Taking? Authorizing Provider  acetaminophen (TYLENOL) 500 MG tablet Take 1,000 mg by mouth every 6 (six) hours as needed for moderate pain or headache.    [provider]  cetirizine (ZYRTEC ALLERGY) 10 MG tablet Take 1 tablet (10 mg total) by mouth daily. 08/09/21   Jaynee Eagles, PA-C  pantoprazole (PROTONIX) 40 MG tablet Take 1  tablet (40 mg total) by mouth daily before breakfast. 12/05/20   Rehman, Mechele Dawley, MD  predniSONE (STERAPRED UNI-PAK 21 TAB) 10 MG (21) TBPK tablet Take by mouth daily. Take 6 tabs by mouth daily  for 2 days, then 5 tabs for 2 days, then 4 tabs for 2 days, then 3 tabs for 2 days, 2 tabs for 2 days, then 1 tab by mouth daily for 2 days 06/18/22   Eulogio Bear, NP  psyllium (METAMUCIL SMOOTH TEXTURE) 58.6 % powder Take 1 packet by mouth at bedtime. Patient not taking: Reported on 11/15/2020 10/11/20   Rogene Houston, MD  triamcinolone cream (KENALOG) 0.1 % Apply 1 application topically 2 (two) times daily. 07/05/21   Volney American, PA-C    Family History Family History  Problem Relation Age of Onset   Kidney disease Mother    Diabetes Father    Thyroid disease Sister    Healthy Sister     Social History Social History   Tobacco Use   Smoking status: Never   Smokeless tobacco: Never  Vaping Use   Vaping Use: Never used  Substance Use Topics   Alcohol use: Never   Drug use: Not Currently     Allergies   Patient has no known allergies.   Review of Systems Review of Systems Per HPI  Physical Exam Triage Vital Signs ED Triage Vitals  Enc Vitals Group     BP 10/10/22 0841 121/75  Pulse Rate 10/10/22 0841 88     Resp 10/10/22 0841 18     Temp 10/10/22 0841 98.7 F (37.1 C)     Temp Source 10/10/22 0841 Oral     SpO2 10/10/22 0841 95 %     Weight --      Height --      Head Circumference --      Peak Flow --      Pain Score 10/10/22 0842 5     Pain Loc --      Pain Edu? --      Excl. in [place]? --    No data found.  Updated Vital Signs BP 121/75 (BP Location: Right Arm)   Pulse 88   Temp 98.7 F (37.1 C) (Oral)   Resp 18   LMP  (LMP Unknown)   SpO2 95%   Visual Acuity Right Eye Distance:   Left Eye Distance:   Bilateral Distance:    Right Eye Near:   Left Eye Near:    Bilateral Near:     Physical Exam Vitals and nursing note reviewed.   Constitutional:      Appearance: Normal appearance.  HENT:     Head: Atraumatic.     Right Ear: Tympanic membrane and external ear normal.     Left Ear: Tympanic membrane and external ear normal.     Nose: Rhinorrhea present.     Mouth/Throat:     Mouth: Mucous membranes are moist.     Pharynx: Posterior oropharyngeal erythema present.  Eyes:     Extraocular Movements: Extraocular movements intact.     Conjunctiva/sclera: Conjunctivae normal.  Cardiovascular:     Rate and Rhythm: Normal rate and regular rhythm.     Heart sounds: Normal heart sounds.  Pulmonary:     Effort: Pulmonary effort is normal.     Breath sounds: Normal breath sounds. No wheezing or rales.  Musculoskeletal:        General: Normal range of motion.     Cervical back: Normal range of motion and neck supple.  Skin:    General: Skin is warm and dry.  Neurological:     Mental Status: She is alert and oriented to person, place, and time.  Psychiatric:        Mood and Affect: Mood normal.        Thought Content: Thought content normal.      UC Treatments / Results  Labs (all labs ordered are listed, but only abnormal results are displayed) Labs Reviewed - No data to display  EKG   Radiology No results found.  Procedures Procedures (including critical care time)  Medications Ordered in UC Medications - No data to display  Initial Impression / Assessment and Plan / UC Course  I have reviewed the triage vital signs and the nursing notes.  Pertinent labs & imaging results that were available during my care of the patient were reviewed by me and considered in my medical decision making (see chart for details).     Vitals and exam very reassuring and suggestive of a viral respiratory infection.  Offered COVID-19 testing, patient declines at this time.  Offered multiple medications to help with her symptoms but she declines these and is requesting amoxicillin.  Discussed that there is no bacterial  infection so amoxicillin would not be warranted or helpful.  Supportive home care and return precautions reviewed.  Final Clinical Impressions(s) / UC Diagnoses   Final diagnoses:  Viral URI with cough  Discharge Instructions      Symptoms are consistent with a viral upper respiratory infection, possibly COVID-19.  These are treated with supportive medications such as DayQuil, NyQuil, nasal sprays, Mucinex, throat sprays and lozenges, ibuprofen and Tylenol, humidifiers, fluids, rest.    ED Prescriptions   None    PDMP not reviewed this encounter.   Volney American, [place] 10/10/22 1319

## 2022-10-10 NOTE — Discharge Instructions (Signed)
Symptoms are consistent with a viral upper respiratory infection, possibly COVID-19.  These are treated with supportive medications such as DayQuil, NyQuil, nasal sprays, Mucinex, throat sprays and lozenges, ibuprofen and Tylenol, humidifiers, fluids, rest.
